# Patient Record
Sex: Male | Born: 1960 | Race: White | Hispanic: No | Marital: Single | Smoking: Current every day smoker
Health system: Southern US, Community
[De-identification: ages and names within clinical notes are randomized; demographics above are authoritative.]

## PROBLEM LIST (undated history)

## (undated) DIAGNOSIS — F419 Anxiety disorder, unspecified: Secondary | ICD-10-CM

## (undated) DIAGNOSIS — I10 Essential (primary) hypertension: Secondary | ICD-10-CM

## (undated) DIAGNOSIS — E785 Hyperlipidemia, unspecified: Secondary | ICD-10-CM

## (undated) HISTORY — DX: Hyperlipidemia, unspecified: E78.5

## (undated) HISTORY — PX: TOTAL HIP ARTHROPLASTY: SHX124

## (undated) HISTORY — DX: Essential (primary) hypertension: I10

## (undated) HISTORY — DX: Anxiety disorder, unspecified: F41.9

---

## 2006-11-22 ENCOUNTER — Ambulatory Visit: Payer: Self-pay | Admitting: Internal Medicine

## 2006-12-16 ENCOUNTER — Ambulatory Visit: Payer: Self-pay | Admitting: Internal Medicine

## 2007-01-15 ENCOUNTER — Ambulatory Visit: Payer: Self-pay | Admitting: Internal Medicine

## 2007-01-15 ENCOUNTER — Ambulatory Visit: Payer: Self-pay | Admitting: General Practice

## 2007-01-22 ENCOUNTER — Inpatient Hospital Stay: Payer: Self-pay | Admitting: General Practice

## 2007-04-22 HISTORY — PX: UMBILICAL HERNIA REPAIR: SHX196

## 2008-05-31 IMAGING — CR DG HIP COMPLETE 2+V*L*
1 series · 1 of 1 positions shown · non-contrast
Comparison: none

REASON FOR EXAM: postop
COMMENTS:   Bedside (portable):Y

PROCEDURE:     DXR - DXR HIP LEFT COMPLETE  - January 22, 2007 [DATE]
RESULT:     Comparison: No available comparison exam.

[view not recorded]
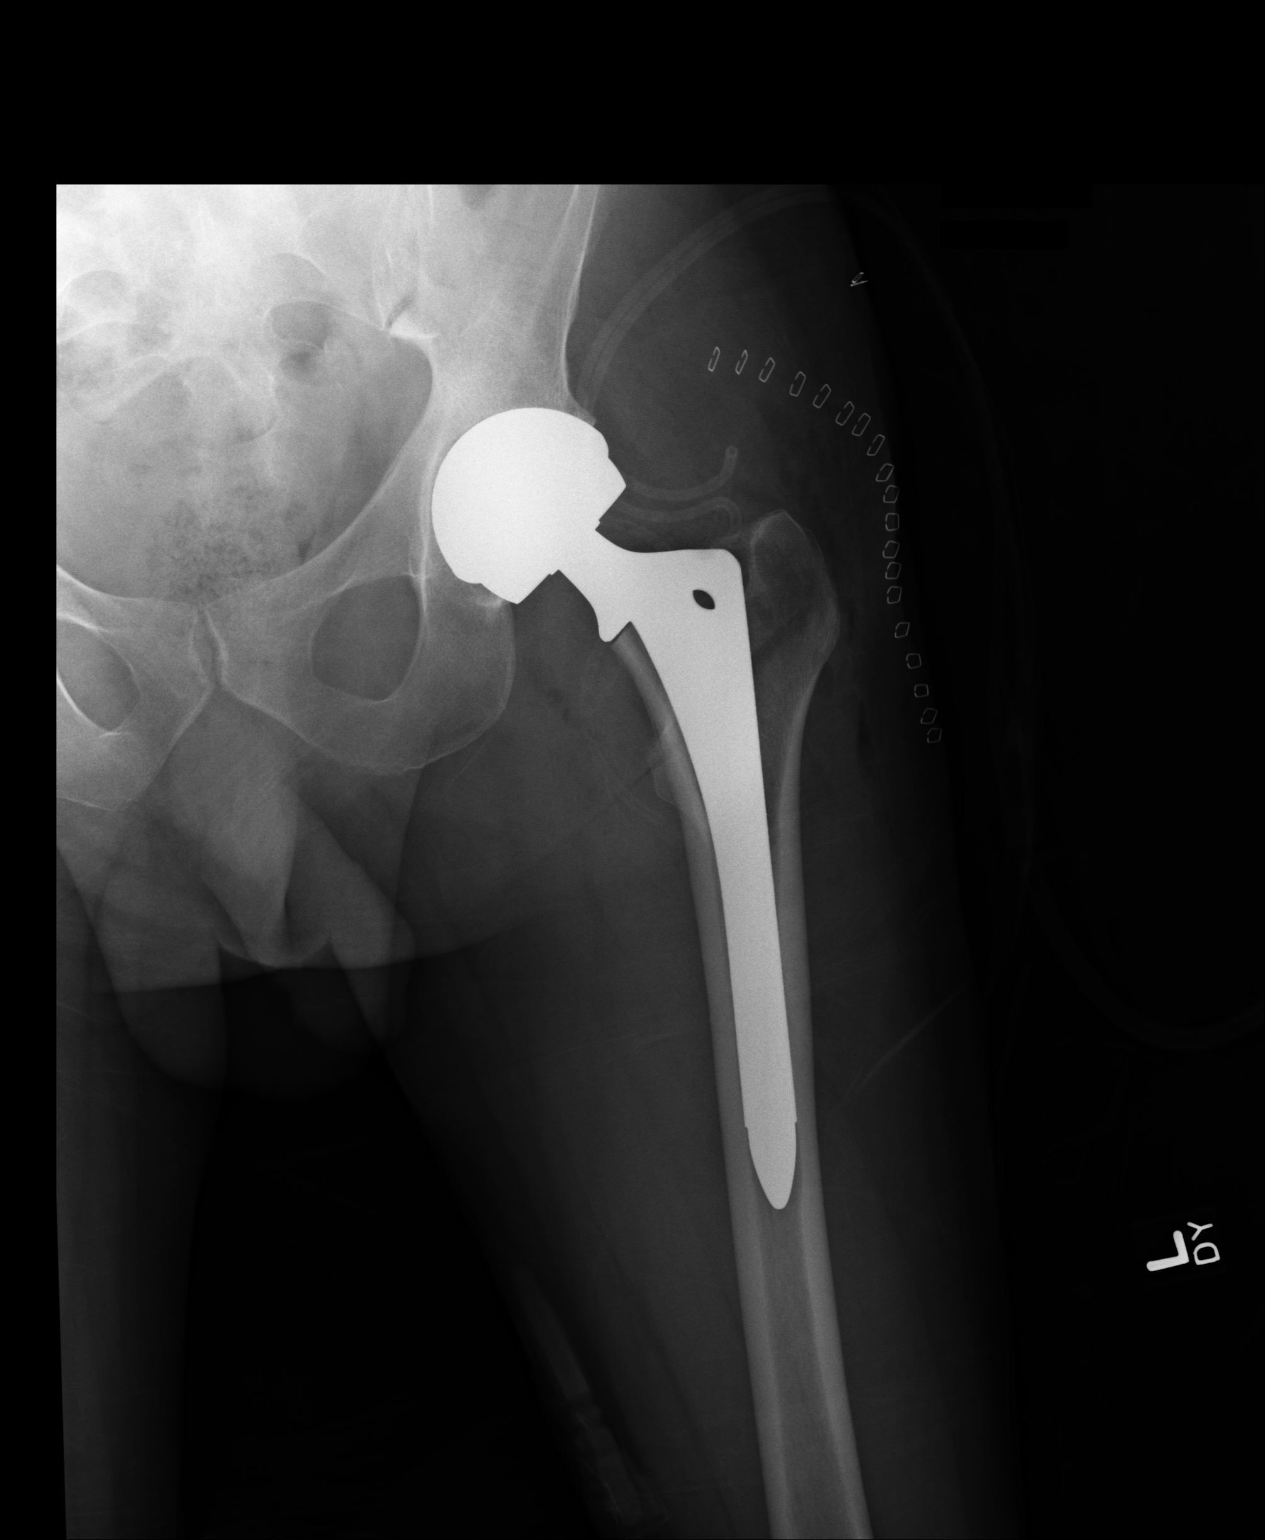

[1 of 1 positions shown; findings below may reference images not displayed]

FINDINGS: Two views of the left hip were obtained.

Left hip arthroplasty is seen with hardware in good position and alignment.
Associated postoperative changes are noted. No fracture is seen.
IMPRESSION: 1. Status post left hip arthroplasty.

## 2015-01-27 ENCOUNTER — Encounter: Payer: Self-pay | Admitting: Family Medicine

## 2015-01-27 ENCOUNTER — Other Ambulatory Visit: Payer: Self-pay

## 2015-01-27 ENCOUNTER — Ambulatory Visit (INDEPENDENT_AMBULATORY_CARE_PROVIDER_SITE_OTHER): Payer: Self-pay | Admitting: Family Medicine

## 2015-01-27 VITALS — BP 112/82 | HR 68 | Temp 97.6°F | Resp 16 | Ht 64.0 in | Wt 159.0 lb

## 2015-01-27 DIAGNOSIS — E781 Pure hyperglyceridemia: Secondary | ICD-10-CM | POA: Insufficient documentation

## 2015-01-27 DIAGNOSIS — J4520 Mild intermittent asthma, uncomplicated: Secondary | ICD-10-CM

## 2015-01-27 DIAGNOSIS — F419 Anxiety disorder, unspecified: Secondary | ICD-10-CM

## 2015-01-27 DIAGNOSIS — C9110 Chronic lymphocytic leukemia of B-cell type not having achieved remission: Secondary | ICD-10-CM | POA: Insufficient documentation

## 2015-01-27 DIAGNOSIS — I10 Essential (primary) hypertension: Secondary | ICD-10-CM | POA: Insufficient documentation

## 2015-01-27 DIAGNOSIS — F17200 Nicotine dependence, unspecified, uncomplicated: Secondary | ICD-10-CM | POA: Insufficient documentation

## 2015-01-27 DIAGNOSIS — M8700 Idiopathic aseptic necrosis of unspecified bone: Secondary | ICD-10-CM

## 2015-01-27 DIAGNOSIS — M879 Osteonecrosis, unspecified: Secondary | ICD-10-CM

## 2015-01-27 DIAGNOSIS — Z856 Personal history of leukemia: Secondary | ICD-10-CM

## 2015-01-27 DIAGNOSIS — J309 Allergic rhinitis, unspecified: Secondary | ICD-10-CM | POA: Insufficient documentation

## 2015-01-27 DIAGNOSIS — Z125 Encounter for screening for malignant neoplasm of prostate: Secondary | ICD-10-CM

## 2015-01-27 MED ORDER — AMLODIPINE BESYLATE 2.5 MG PO TABS: 2.5000 mg | ORAL_TABLET | Freq: Every day | ORAL | Status: DC

## 2015-01-27 MED ORDER — ALBUTEROL SULFATE HFA 108 (90 BASE) MCG/ACT IN AERS: 2.0000 | INHALATION_SPRAY | Freq: Four times a day (QID) | RESPIRATORY_TRACT | Status: DC | PRN

## 2015-01-27 MED ORDER — CLONAZEPAM 1 MG PO TABS: 0.5000 mg | ORAL_TABLET | Freq: Two times a day (BID) | ORAL | Status: DC | PRN

## 2015-01-27 MED ORDER — MELOXICAM 15 MG PO TABS: 15.0000 mg | ORAL_TABLET | Freq: Every day | ORAL | Status: DC

## 2015-01-27 MED ORDER — TRAMADOL HCL 50 MG PO TABS: 50.0000 mg | ORAL_TABLET | Freq: Four times a day (QID) | ORAL | Status: DC | PRN

## 2015-01-27 NOTE — Progress Notes (Signed)
Subjective:     Patient ID: Eddie Ward, male   DOB: [birth year], 54 y.o.   MRN: [id number]  HPI  Chief Complaint  Patient presents with  . Hypertension    patient is here to follow up from last office visit 01/19/2014, patient was advised continue current medication. Last blood pressure in office was 132/62  . Anxiety    patient is here to folloe up from 01/19/14, condition was stable on Clonazepam.  . 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   Review of Systems  Constitutional: Negative for fever, chills and fatigue.  Respiratory: Positive for shortness of breath (tapering cigarette use.).        Controls mild shortness of breath/asthma with use of albuterol MDI 3-4 x day. Could not afford Spiriva in the past.  Cardiovascular: Negative for chest pain and palpitations.  Gastrointestinal: Negative for blood in stool.  Genitourinary: Negative for difficulty urinating.       No nocturia  Hematological: Does not bruise/bleed easily.  Psychiatric/Behavioral:       Continue to control anxiety with twice daily clonazepam.       Objective:   Physical Exam  Constitutional: He appears well-developed and well-nourished. No distress.  Cardiovascular: Normal rate and regular rhythm.   Pulmonary/Chest: Breath sounds normal. He has no wheezes.  Musculoskeletal: He exhibits no edema (of lower extremities).  Antalgic gait       Assessment:    1. Anxiety - clonazePAM (KLONOPIN) 1 MG tablet; Take 0.5 tablets (0.5 mg total) by mouth 2 (two) times daily as needed for anxiety.  Dispense: 60 tablet; Refill: 5  2. Essential hypertensio - Comprehensive metabolic panel - amLODipine  (NORVASC) 2.5 MG tablet; Take 1 tablet (2.5 mg total) by mouth daily.  Dispense: 30 tablet; Refill: 11  3. Hypertriglyceridemia - Lipid panel  4. Aseptic necrosis of bone - meloxicam (MOBIC) 15 MG tablet; Take 1 tablet (15 mg total) by mouth daily.  Dispense: 90 tablet; Refill: 3 - traMADol (ULTRAM) 50 MG tablet; Take 1 tablet (50 mg total) by mouth every 6 (six) hours as needed for moderate pain.  Dispense: 90 tablet; Refill: 5  5. Personal history of CLL (chronic lymphocytic leukemia) - CBC with Differential/Platelet  6. Prostate cancer screenin - PSA  7. Asthma, mild intermittent, uncomplicated - albuterol (VENTOLIN HFA) 108 (90 BASE) MCG/ACT inhaler; Inhale 2 puffs into the lungs every 6 (six) hours as needed for wheezing or shortness of breath.  Dispense: 18 g; Refill: 11    Plan:   Further f/u pending lab results

## 2015-01-27 NOTE — Patient Instructions (Signed)
We will call you with lab results.       

## 2015-02-03 DIAGNOSIS — Z0000 Encounter for general adult medical examination without abnormal findings: Secondary | ICD-10-CM

## 2015-02-03 LAB — CBC WITH DIFFERENTIAL/PLATELET
BASOS ABS: 0 10*3/uL (ref 0.0–0.2)
BASOS: 1 %
EOS (ABSOLUTE): 0.2 10*3/uL (ref 0.0–0.4)
Eos: 3 %
HEMATOCRIT: 42 % (ref 37.5–51.0)
HEMOGLOBIN: 14.8 g/dL (ref 12.6–17.7)
IMMATURE GRANS (ABS): 0 10*3/uL (ref 0.0–0.1)
Immature Granulocytes: 0 %
LYMPHS: 34 %
Lymphocytes Absolute: 2.1 10*3/uL (ref 0.7–3.1)
MCH: 29.7 pg (ref 26.6–33.0)
MCHC: 35.2 g/dL (ref 31.5–35.7)
MCV: 84 fL (ref 79–97)
Monocytes Absolute: 0.6 10*3/uL (ref 0.1–0.9)
Monocytes: 10 %
NEUTROS PCT: 52 %
Neutrophils Absolute: 3.2 10*3/uL (ref 1.4–7.0)
Platelets: 139 10*3/uL — ABNORMAL LOW (ref 150–379)
RBC: 4.98 x10E6/uL (ref 4.14–5.80)
RDW: 13.6 % (ref 12.3–15.4)
WBC: 6.1 10*3/uL (ref 3.4–10.8)

## 2015-02-04 ENCOUNTER — Telehealth: Payer: Self-pay

## 2015-02-04 LAB — COMPREHENSIVE METABOLIC PANEL
A/G RATIO: 1.9 (ref 1.1–2.5)
ALT: 29 IU/L (ref 0–44)
AST: 20 IU/L (ref 0–40)
Albumin: 3.9 g/dL (ref 3.5–5.5)
Alkaline Phosphatase: 68 IU/L (ref 39–117)
BUN / CREAT RATIO: 9 (ref 9–20)
BUN: 11 mg/dL (ref 6–24)
Bilirubin Total: 0.3 mg/dL (ref 0.0–1.2)
CO2: 22 mmol/L (ref 18–29)
CREATININE: 1.18 mg/dL (ref 0.76–1.27)
Calcium: 8.9 mg/dL (ref 8.7–10.2)
Chloride: 100 mmol/L (ref 97–108)
GFR calc Af Amer: 81 mL/min/{1.73_m2} (ref >59)
GFR calc non Af Amer: 70 mL/min/{1.73_m2} (ref >59)
Globulin, Total: 2.1 g/dL (ref 1.5–4.5)
Glucose: 100 mg/dL — ABNORMAL HIGH (ref 65–99)
Potassium: 4.6 mmol/L (ref 3.5–5.2)
SODIUM: 136 mmol/L (ref 134–144)
Total Protein: 6 g/dL (ref 6.0–8.5)

## 2015-02-04 LAB — LIPID PANEL
CHOL/HDL RATIO: 8.3 ratio — AB (ref 0.0–5.0)
Cholesterol, Total: 166 mg/dL (ref 100–199)
HDL: 20 mg/dL — ABNORMAL LOW (ref >39)
LDL CALC: 95 mg/dL (ref 0–99)
Triglycerides: 254 mg/dL — ABNORMAL HIGH (ref 0–149)
VLDL Cholesterol Cal: 51 mg/dL — ABNORMAL HIGH (ref 5–40)

## 2015-02-04 LAB — PSA: Prostate Specific Ag, Serum: 0.9 ng/mL (ref 0.0–4.0)

## 2015-02-04 NOTE — Telephone Encounter (Signed)
[id number] 

## 2015-02-04 NOTE — Telephone Encounter (Signed)
-----   Message from Carmon Ginsberg, [place] sent at 02/04/2015  8:58 AM EDT ----- Labs are generally ok but due to low HDL cholesterol, smoking, and hx of hypertension your calculated 10 year risk for developing cardiovascular disease is 15.7 %. Statin medication is usually recommended above 7.5 %. What do you wish to do?

## 2015-02-05 NOTE — Telephone Encounter (Signed)
Patient has been advised

## 2015-02-05 NOTE — Telephone Encounter (Signed)
We have been prescribing 1 mg. My thinking was he was splitting them to get the 0.5 mg.dose

## 2015-02-05 NOTE — Telephone Encounter (Signed)
Spoke with patient on phone he stated at last office visit that you both discussed weaning patient off medication because he did not need it anymore. Patient states at office visit you stated that patient will start at 1/2 tablet and slowly taper off. He wants to know what he should do because he does not want to stop medication completley. Please advise.

## 2015-02-05 NOTE — Telephone Encounter (Signed)
Pt called this morning.  He would like someone to call him back asap regarding his labs and his Rx.    [phone]

## 2015-02-05 NOTE — Telephone Encounter (Signed)
Try 1/2 pill once daily for 1-2 weeks then every other day for 2 weeks. Once off may use occasionally as needed for acute anxiety.

## 2015-02-05 NOTE — Telephone Encounter (Signed)
Patient was advised of lab results he states that he is declining at this time a statin drug and his HDL cholesterol will always be poor due to lack of exercise because of his bad knees. Patient wanted to note that his Rx for Clonazepam that was sent to pharmacy says 1mg  daily and he states he was taking 1/2mg . He wants to know if this was an error on the pharmacy side or did you increase dose? Please advise. KW

## 2015-02-08 ENCOUNTER — Telehealth: Payer: Self-pay

## 2015-02-08 NOTE — Telephone Encounter (Signed)
-----   Message from Carmon Ginsberg, [place] sent at 02/04/2015  8:58 AM EDT ----- Labs are generally ok but due to low HDL cholesterol, smoking, and hx of hypertension your calculated 10 year risk for developing cardiovascular disease is 15.7 %. Statin medication is usually recommended above 7.5 %. What do you wish to do?

## 2015-03-01 ENCOUNTER — Other Ambulatory Visit: Payer: Self-pay | Admitting: Family Medicine

## 2015-03-01 ENCOUNTER — Telehealth: Payer: Self-pay | Admitting: Family Medicine

## 2015-03-01 DIAGNOSIS — F419 Anxiety disorder, unspecified: Secondary | ICD-10-CM

## 2015-03-01 MED ORDER — CLONAZEPAM 0.5 MG PO TABS: 0.5000 mg | ORAL_TABLET | Freq: Two times a day (BID) | ORAL | Status: DC | PRN

## 2015-03-01 MED ORDER — CLONAZEPAM 1 MG PO TABS: 0.5000 mg | ORAL_TABLET | Freq: Two times a day (BID) | ORAL | Status: DC | PRN

## 2015-03-01 NOTE — Telephone Encounter (Signed)
New rx is available to be picked up here.

## 2015-03-01 NOTE — Telephone Encounter (Signed)
Left message to call back  

## 2015-03-01 NOTE — Telephone Encounter (Signed)
Pt contacted office for refill request on the following medications:  clonazePAM (KLONOPIN) 1 MG.  Pt states this should be for .05mg .  [place].  CB#[phone]

## 2015-03-01 NOTE — Telephone Encounter (Signed)
Refill Request.  

## 2015-06-23 ENCOUNTER — Other Ambulatory Visit: Payer: Self-pay | Admitting: Family Medicine

## 2015-07-29 ENCOUNTER — Other Ambulatory Visit: Payer: Self-pay | Admitting: Family Medicine

## 2015-07-29 ENCOUNTER — Telehealth: Payer: Self-pay

## 2015-07-29 NOTE — Telephone Encounter (Signed)
-----   Message from Carmon Ginsberg, [place] sent at 07/29/2015 12:30 PM EST ----- Please call in rx for tramadol as updated in the EMR

## 2015-07-29 NOTE — Telephone Encounter (Signed)
Called in prescription to [facility] on [address]. KW

## 2015-07-30 ENCOUNTER — Other Ambulatory Visit: Payer: Self-pay | Admitting: Family Medicine

## 2015-08-29 ENCOUNTER — Other Ambulatory Visit: Payer: Self-pay | Admitting: Family Medicine

## 2015-08-30 ENCOUNTER — Other Ambulatory Visit: Payer: Self-pay | Admitting: Family Medicine

## 2015-11-08 ENCOUNTER — Other Ambulatory Visit: Payer: Self-pay | Admitting: Family Medicine

## 2016-01-20 ENCOUNTER — Other Ambulatory Visit: Payer: Self-pay | Admitting: Family Medicine

## 2016-01-25 ENCOUNTER — Ambulatory Visit (INDEPENDENT_AMBULATORY_CARE_PROVIDER_SITE_OTHER): Payer: Self-pay | Admitting: Family Medicine

## 2016-01-25 ENCOUNTER — Encounter: Payer: Self-pay | Admitting: Family Medicine

## 2016-01-25 VITALS — BP 140/90 | HR 105 | Temp 97.7°F | Resp 20 | Ht 64.0 in | Wt 160.0 lb

## 2016-01-25 DIAGNOSIS — I10 Essential (primary) hypertension: Secondary | ICD-10-CM

## 2016-01-25 DIAGNOSIS — F419 Anxiety disorder, unspecified: Secondary | ICD-10-CM

## 2016-01-25 DIAGNOSIS — J4530 Mild persistent asthma, uncomplicated: Secondary | ICD-10-CM

## 2016-01-25 DIAGNOSIS — J45909 Unspecified asthma, uncomplicated: Secondary | ICD-10-CM | POA: Insufficient documentation

## 2016-01-25 DIAGNOSIS — F17200 Nicotine dependence, unspecified, uncomplicated: Secondary | ICD-10-CM

## 2016-01-25 DIAGNOSIS — M8700 Idiopathic aseptic necrosis of unspecified bone: Secondary | ICD-10-CM

## 2016-01-25 MED ORDER — MONTELUKAST SODIUM 10 MG PO TABS: 10.0000 mg | ORAL_TABLET | Freq: Every day | ORAL | Status: DC

## 2016-01-25 NOTE — Patient Instructions (Signed)
Let me know how the Singulair is doing.

## 2016-01-25 NOTE — Progress Notes (Signed)
Subjective:     Patient ID: Eddie Ward, male   DOB: May 14, 1961, 54 y.o.   MRN: [id number]  HPI Chief Complaint  Patient presents with  . Anxiety  . Hypertension  States he is here in f/u of his chronic medical problems. -currently uninsured. Mainly concerned about frequent use of Ventolin MDI and wishes to try Singulair for maintenance. Continues to smoke 1/2 ppd. Does not wish labs or colonoscopy at this time.  Review of Systems  Respiratory: Negative for shortness of breath (except associated with asthma).   Cardiovascular: Negative for chest pain and palpitations.       Monitors his bp at home and is usually lower than today.  Musculoskeletal:       Right hip pain from AVN is getting worse but is working on health insurance to try to afford surgery. Currently maintained with meloxicam and tramadol 3 x day.  Psychiatric/Behavioral:       Anxiety controlled with clonazepam.       Objective:   Physical Exam  Constitutional: He appears well-developed and well-nourished. Distressed: walks with a limp.  Cardiovascular: Normal rate and regular rhythm.   Pulmonary/Chest: Breath sounds normal. He has no wheezes.  Musculoskeletal: He exhibits no edema (of lower extremties).       Assessment:    1. Anxiety: stable  2. Essential hypertension: stable  3. Aseptic necrosis of bone ([place]) 4. Tobacco use disorder  5. Asthma, mild persistent, uncomplicated - montelukast (SINGULAIR) 10 MG tablet; Take 1 tablet (10 mg total) by mouth at bedtime.  Dispense: 30 tablet; Refill: 11    Plan:    Phone f/u regarding asthma and refills as needed.

## 2016-01-26 ENCOUNTER — Other Ambulatory Visit: Payer: Self-pay | Admitting: Family Medicine

## 2016-01-26 DIAGNOSIS — I10 Essential (primary) hypertension: Secondary | ICD-10-CM

## 2016-01-26 NOTE — Telephone Encounter (Signed)
LOV 01/25/2016. Renaldo Fiddler, CMA

## 2016-02-22 ENCOUNTER — Other Ambulatory Visit: Payer: Self-pay | Admitting: Family Medicine

## 2016-02-23 ENCOUNTER — Other Ambulatory Visit: Payer: Self-pay | Admitting: Family Medicine

## 2016-02-23 NOTE — Telephone Encounter (Signed)
prescription was called into pharmacy. KW

## 2016-03-16 ENCOUNTER — Other Ambulatory Visit: Payer: Self-pay | Admitting: Family Medicine

## 2016-03-16 NOTE — Telephone Encounter (Signed)
Rx has been called into pharmacy. KW 

## 2016-07-17 HISTORY — PX: TOTAL HIP ARTHROPLASTY: SHX124

## 2016-07-20 ENCOUNTER — Other Ambulatory Visit: Payer: Self-pay | Admitting: Family Medicine

## 2016-07-20 NOTE — Telephone Encounter (Signed)
LOV 01/25/2016. Renaldo Fiddler, CMA

## 2016-08-20 ENCOUNTER — Other Ambulatory Visit: Payer: Self-pay | Admitting: Family Medicine

## 2016-08-21 ENCOUNTER — Other Ambulatory Visit: Payer: Self-pay | Admitting: Family Medicine

## 2016-08-21 NOTE — Telephone Encounter (Signed)
Prescription has been called into pharmacy. KW 

## 2016-08-21 NOTE — Telephone Encounter (Signed)
Called in. Eddie Ward, CMA  

## 2016-09-21 ENCOUNTER — Other Ambulatory Visit: Payer: Self-pay | Admitting: Family Medicine

## 2016-09-21 NOTE — Telephone Encounter (Signed)
prescription has been called into pharmacy. KW 

## 2016-11-08 DIAGNOSIS — M25551 Pain in right hip: Secondary | ICD-10-CM | POA: Diagnosis not present

## 2016-11-08 DIAGNOSIS — M87051 Idiopathic aseptic necrosis of right femur: Secondary | ICD-10-CM | POA: Diagnosis not present

## 2016-11-08 DIAGNOSIS — G8929 Other chronic pain: Secondary | ICD-10-CM | POA: Diagnosis not present

## 2016-11-08 DIAGNOSIS — Z96642 Presence of left artificial hip joint: Secondary | ICD-10-CM | POA: Diagnosis not present

## 2017-01-10 DIAGNOSIS — M25551 Pain in right hip: Secondary | ICD-10-CM | POA: Diagnosis not present

## 2017-01-10 DIAGNOSIS — M87051 Idiopathic aseptic necrosis of right femur: Secondary | ICD-10-CM | POA: Diagnosis not present

## 2017-01-10 DIAGNOSIS — G8929 Other chronic pain: Secondary | ICD-10-CM | POA: Diagnosis not present

## 2017-01-10 DIAGNOSIS — Z01818 Encounter for other preprocedural examination: Secondary | ICD-10-CM | POA: Diagnosis not present

## 2017-01-22 DIAGNOSIS — R9431 Abnormal electrocardiogram [ECG] [EKG]: Secondary | ICD-10-CM | POA: Diagnosis not present

## 2017-01-24 DIAGNOSIS — M1611 Unilateral primary osteoarthritis, right hip: Secondary | ICD-10-CM | POA: Diagnosis not present

## 2017-01-30 ENCOUNTER — Encounter: Payer: Self-pay | Admitting: Family Medicine

## 2017-01-30 ENCOUNTER — Ambulatory Visit (INDEPENDENT_AMBULATORY_CARE_PROVIDER_SITE_OTHER): Payer: 59 | Admitting: Family Medicine

## 2017-01-30 VITALS — BP 122/84 | HR 86 | Temp 98.2°F | Resp 16 | Wt 149.8 lb

## 2017-01-30 DIAGNOSIS — M8700 Idiopathic aseptic necrosis of unspecified bone: Secondary | ICD-10-CM | POA: Diagnosis not present

## 2017-01-30 DIAGNOSIS — F419 Anxiety disorder, unspecified: Secondary | ICD-10-CM | POA: Diagnosis not present

## 2017-01-30 DIAGNOSIS — I10 Essential (primary) hypertension: Secondary | ICD-10-CM

## 2017-01-30 MED ORDER — AMLODIPINE BESYLATE 5 MG PO TABS: 5.0000 mg | ORAL_TABLET | Freq: Every day | ORAL | 0 refills | Status: DC

## 2017-01-30 NOTE — Patient Instructions (Signed)
Good luck with your surgery. We can recheck your blood pressure after you have completed post-operative recovery.

## 2017-01-30 NOTE — Progress Notes (Signed)
Subjective:     Patient ID: Eddie Ward, male   DOB: [birth year], 56 y.o.   MRN: [id number]  HPI  Chief Complaint  Patient presents with  . Advice Only    Patient comes in office today to discuss with P.A about his upcoming hip replacement to the right hip on 02/06/17.   States he has gotten to the point his quality of life has declined due to his R hip AVN. His surgeon will be Burnis Kingfisher at [facility]. He has quit smoking a week ago and is tapering down his alcohol use. Reports using Ventolin only once daily now. Had preop workup including echocardiogram (normal) and labs. Amlodipine was increased to 5 mg. Continues to use clonazepam twice daily but may wish to taper off after he is able to resume normal activities again.   Review of Systems     Objective:   Physical Exam  Constitutional: He appears well-developed and well-nourished. No distress.  Cardiovascular: Normal rate and regular rhythm.   Pulmonary/Chest: He has no wheezes.  Diminished breath sounds bilaterally  Musculoskeletal: He exhibits no edema (of lower extremities).  Psychiatric:  anxious       Assessment:    1. Essential hypertension - amLODipine (NORVASC) 5 MG tablet; Take 1 tablet (5 mg total) by mouth daily.  Dispense: 90 tablet; Refill: 0  2. Aseptic necrosis of bone [facility]): pending surgery 7/24  3. Anxiety: continue clonazepam    Plan:    F/u after post-surgical recovery completed

## 2017-02-06 DIAGNOSIS — I10 Essential (primary) hypertension: Secondary | ICD-10-CM | POA: Diagnosis not present

## 2017-02-06 DIAGNOSIS — E785 Hyperlipidemia, unspecified: Secondary | ICD-10-CM | POA: Diagnosis not present

## 2017-02-06 DIAGNOSIS — Z96641 Presence of right artificial hip joint: Secondary | ICD-10-CM | POA: Diagnosis not present

## 2017-02-06 DIAGNOSIS — Z471 Aftercare following joint replacement surgery: Secondary | ICD-10-CM | POA: Diagnosis not present

## 2017-02-06 DIAGNOSIS — R7989 Other specified abnormal findings of blood chemistry: Secondary | ICD-10-CM | POA: Diagnosis not present

## 2017-02-06 DIAGNOSIS — M1611 Unilateral primary osteoarthritis, right hip: Secondary | ICD-10-CM | POA: Diagnosis not present

## 2017-02-06 DIAGNOSIS — R7309 Other abnormal glucose: Secondary | ICD-10-CM | POA: Diagnosis not present

## 2017-02-08 DIAGNOSIS — J4520 Mild intermittent asthma, uncomplicated: Secondary | ICD-10-CM | POA: Diagnosis not present

## 2017-02-08 DIAGNOSIS — Z471 Aftercare following joint replacement surgery: Secondary | ICD-10-CM | POA: Diagnosis not present

## 2017-02-08 DIAGNOSIS — I10 Essential (primary) hypertension: Secondary | ICD-10-CM | POA: Diagnosis not present

## 2017-02-12 ENCOUNTER — Encounter: Payer: Self-pay | Admitting: Family Medicine

## 2017-02-12 ENCOUNTER — Ambulatory Visit (INDEPENDENT_AMBULATORY_CARE_PROVIDER_SITE_OTHER): Payer: 59 | Admitting: Family Medicine

## 2017-02-12 VITALS — BP 138/86 | HR 85 | Temp 99.4°F | Resp 16 | Wt 150.4 lb

## 2017-02-12 DIAGNOSIS — R944 Abnormal results of kidney function studies: Secondary | ICD-10-CM

## 2017-02-12 DIAGNOSIS — I10 Essential (primary) hypertension: Secondary | ICD-10-CM

## 2017-02-12 NOTE — Patient Instructions (Signed)
We will call you with the lab results. 

## 2017-02-12 NOTE — Progress Notes (Signed)
Subjective:     Patient ID: Eddie Ward, male   DOB: September 15, 1960, 56 y.o.   MRN: [id number]  HPI  Chief Complaint  Patient presents with  . Abnormal Lab    Patient comes in office today to discuss abnormal lab report showing a elevated kidney function after  right THR 02/06/17.   GFR was noted to be 59. Patient states he was not allowed to drink for 30 hours prior to surgery and for a time afterwards. 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 States he has been able to stay off cigarettes and beer since before surgery.  Review of Systems     Objective:   Physical Exam  Constitutional: He appears well-developed and well-nourished. No distress.  Cardiovascular: Normal rate and regular rhythm.   Pulmonary/Chest: Breath sounds normal.  Musculoskeletal: He exhibits edema (post operative swelling of RLE).       Assessment:    1. Essential hypertension: stable  2. Decreased GFR: will get labs in the next week - Renal function panel    Plan:   Further f/u pending lab work.

## 2017-02-13 DIAGNOSIS — I10 Essential (primary) hypertension: Secondary | ICD-10-CM | POA: Diagnosis not present

## 2017-02-13 DIAGNOSIS — J4520 Mild intermittent asthma, uncomplicated: Secondary | ICD-10-CM | POA: Diagnosis not present

## 2017-02-13 DIAGNOSIS — Z471 Aftercare following joint replacement surgery: Secondary | ICD-10-CM | POA: Diagnosis not present

## 2017-02-15 DIAGNOSIS — J4520 Mild intermittent asthma, uncomplicated: Secondary | ICD-10-CM | POA: Diagnosis not present

## 2017-02-15 DIAGNOSIS — I10 Essential (primary) hypertension: Secondary | ICD-10-CM | POA: Diagnosis not present

## 2017-02-15 DIAGNOSIS — Z471 Aftercare following joint replacement surgery: Secondary | ICD-10-CM | POA: Diagnosis not present

## 2017-02-19 ENCOUNTER — Ambulatory Visit: Payer: 59 | Admitting: Family Medicine

## 2017-02-19 ENCOUNTER — Other Ambulatory Visit: Payer: Self-pay | Admitting: Family Medicine

## 2017-02-19 NOTE — Telephone Encounter (Signed)
Called in. Left message at [facility] as written below.

## 2017-02-20 DIAGNOSIS — J4520 Mild intermittent asthma, uncomplicated: Secondary | ICD-10-CM | POA: Diagnosis not present

## 2017-02-20 DIAGNOSIS — I10 Essential (primary) hypertension: Secondary | ICD-10-CM | POA: Diagnosis not present

## 2017-02-20 DIAGNOSIS — Z471 Aftercare following joint replacement surgery: Secondary | ICD-10-CM | POA: Diagnosis not present

## 2017-02-21 DIAGNOSIS — R944 Abnormal results of kidney function studies: Secondary | ICD-10-CM | POA: Diagnosis not present

## 2017-02-22 ENCOUNTER — Telehealth: Payer: Self-pay

## 2017-02-22 DIAGNOSIS — I10 Essential (primary) hypertension: Secondary | ICD-10-CM | POA: Diagnosis not present

## 2017-02-22 DIAGNOSIS — Z471 Aftercare following joint replacement surgery: Secondary | ICD-10-CM | POA: Diagnosis not present

## 2017-02-22 DIAGNOSIS — J4520 Mild intermittent asthma, uncomplicated: Secondary | ICD-10-CM | POA: Diagnosis not present

## 2017-02-22 LAB — RENAL FUNCTION PANEL
ALBUMIN: 3.8 g/dL (ref 3.5–5.5)
BUN/Creatinine Ratio: 6 — ABNORMAL LOW (ref 9–20)
BUN: 5 mg/dL — ABNORMAL LOW (ref 6–24)
CHLORIDE: 102 mmol/L (ref 96–106)
CO2: 24 mmol/L (ref 20–29)
Calcium: 8.9 mg/dL (ref 8.7–10.2)
Creatinine, Ser: 0.87 mg/dL (ref 0.76–1.27)
GFR calc non Af Amer: 97 mL/min/{1.73_m2} (ref >59)
GFR, EST AFRICAN AMERICAN: 112 mL/min/{1.73_m2} (ref >59)
GLUCOSE: 88 mg/dL (ref 65–99)
POTASSIUM: 4.1 mmol/L (ref 3.5–5.2)
Phosphorus: 4 mg/dL (ref 2.5–4.5)
Sodium: 141 mmol/L (ref 134–144)

## 2017-02-22 NOTE — Telephone Encounter (Signed)
Patient has been advised. KW 

## 2017-02-22 NOTE — Telephone Encounter (Signed)
-----   Message from Carmon Ginsberg, [place] sent at 02/22/2017  7:28 AM EDT ----- Your kidney function if now excellent with a GFR of 97

## 2017-02-23 DIAGNOSIS — Z471 Aftercare following joint replacement surgery: Secondary | ICD-10-CM | POA: Diagnosis not present

## 2017-02-23 DIAGNOSIS — Z96641 Presence of right artificial hip joint: Secondary | ICD-10-CM | POA: Diagnosis not present

## 2017-02-27 DIAGNOSIS — J4520 Mild intermittent asthma, uncomplicated: Secondary | ICD-10-CM | POA: Diagnosis not present

## 2017-02-27 DIAGNOSIS — Z471 Aftercare following joint replacement surgery: Secondary | ICD-10-CM | POA: Diagnosis not present

## 2017-02-27 DIAGNOSIS — I10 Essential (primary) hypertension: Secondary | ICD-10-CM | POA: Diagnosis not present

## 2017-03-02 DIAGNOSIS — J4520 Mild intermittent asthma, uncomplicated: Secondary | ICD-10-CM | POA: Diagnosis not present

## 2017-03-02 DIAGNOSIS — I10 Essential (primary) hypertension: Secondary | ICD-10-CM | POA: Diagnosis not present

## 2017-03-02 DIAGNOSIS — Z471 Aftercare following joint replacement surgery: Secondary | ICD-10-CM | POA: Diagnosis not present

## 2017-03-07 DIAGNOSIS — J4520 Mild intermittent asthma, uncomplicated: Secondary | ICD-10-CM | POA: Diagnosis not present

## 2017-03-07 DIAGNOSIS — I10 Essential (primary) hypertension: Secondary | ICD-10-CM | POA: Diagnosis not present

## 2017-03-07 DIAGNOSIS — Z471 Aftercare following joint replacement surgery: Secondary | ICD-10-CM | POA: Diagnosis not present

## 2017-03-14 DIAGNOSIS — I10 Essential (primary) hypertension: Secondary | ICD-10-CM | POA: Diagnosis not present

## 2017-03-14 DIAGNOSIS — J4520 Mild intermittent asthma, uncomplicated: Secondary | ICD-10-CM | POA: Diagnosis not present

## 2017-03-14 DIAGNOSIS — Z471 Aftercare following joint replacement surgery: Secondary | ICD-10-CM | POA: Diagnosis not present

## 2017-03-28 DIAGNOSIS — Z96641 Presence of right artificial hip joint: Secondary | ICD-10-CM | POA: Diagnosis not present

## 2017-03-28 DIAGNOSIS — Z96642 Presence of left artificial hip joint: Secondary | ICD-10-CM | POA: Diagnosis not present

## 2017-03-28 DIAGNOSIS — Z471 Aftercare following joint replacement surgery: Secondary | ICD-10-CM | POA: Diagnosis not present

## 2017-03-30 DIAGNOSIS — I10 Essential (primary) hypertension: Secondary | ICD-10-CM | POA: Diagnosis not present

## 2017-03-30 DIAGNOSIS — J4520 Mild intermittent asthma, uncomplicated: Secondary | ICD-10-CM | POA: Diagnosis not present

## 2017-03-30 DIAGNOSIS — Z471 Aftercare following joint replacement surgery: Secondary | ICD-10-CM | POA: Diagnosis not present

## 2017-04-12 ENCOUNTER — Telehealth: Payer: Self-pay

## 2017-04-12 NOTE — Telephone Encounter (Signed)
Patient called office today upset stating that his insurance company had received his medical records and in records it indicated accoording to patient that he had a active diagnosis of leukemia dated back in office note on 01/2015. I explained to patient that I did not see it as a current active problem and in 2016 his labs did show a low platelet count but other labs were normal and it was never followed back up in reference to platelet count. Patient states that insurance believes that he has lied about his medical history and now needs letter from [facility] stating that patient does not have leukemia or any form of cancer.Patient has been advised that you are out of office till 04-23-17. He is asking when you return to contact his insurance agent Yehuda Budd [phone]. KW

## 2017-04-12 NOTE — Telephone Encounter (Signed)
Address for letter is: [address] [place] [address]

## 2017-04-23 NOTE — Telephone Encounter (Signed)
Letter provided for patient pickup

## 2017-04-30 ENCOUNTER — Other Ambulatory Visit: Payer: Self-pay | Admitting: Family Medicine

## 2017-04-30 DIAGNOSIS — I10 Essential (primary) hypertension: Secondary | ICD-10-CM

## 2017-08-07 ENCOUNTER — Other Ambulatory Visit: Payer: Self-pay | Admitting: Family Medicine

## 2017-08-08 ENCOUNTER — Other Ambulatory Visit: Payer: Self-pay | Admitting: Family Medicine

## 2017-08-08 NOTE — Telephone Encounter (Signed)
Please confirm this was called in as updated 1/22.

## 2017-08-08 NOTE — Telephone Encounter (Signed)
Pharmacist states that prescription has already been called into pharmacy.KW

## 2017-08-08 NOTE — Telephone Encounter (Signed)
Prescription was called into pharmacy and patient was notified. KW

## 2017-10-09 ENCOUNTER — Encounter: Payer: Self-pay | Admitting: Family Medicine

## 2017-10-09 ENCOUNTER — Ambulatory Visit (INDEPENDENT_AMBULATORY_CARE_PROVIDER_SITE_OTHER): Payer: 59 | Admitting: Family Medicine

## 2017-10-09 VITALS — BP 122/84 | HR 96 | Temp 97.5°F | Resp 16 | Wt 152.8 lb

## 2017-10-09 DIAGNOSIS — J4530 Mild persistent asthma, uncomplicated: Secondary | ICD-10-CM | POA: Diagnosis not present

## 2017-10-09 DIAGNOSIS — L309 Dermatitis, unspecified: Secondary | ICD-10-CM

## 2017-10-09 DIAGNOSIS — E781 Pure hyperglyceridemia: Secondary | ICD-10-CM | POA: Diagnosis not present

## 2017-10-09 DIAGNOSIS — I10 Essential (primary) hypertension: Secondary | ICD-10-CM | POA: Diagnosis not present

## 2017-10-09 DIAGNOSIS — Z125 Encounter for screening for malignant neoplasm of prostate: Secondary | ICD-10-CM | POA: Diagnosis not present

## 2017-10-09 DIAGNOSIS — J3089 Other allergic rhinitis: Secondary | ICD-10-CM | POA: Diagnosis not present

## 2017-10-09 MED ORDER — PREDNISONE 20 MG PO TABS: ORAL_TABLET | ORAL | 1 refills | Status: DC

## 2017-10-09 MED ORDER — MONTELUKAST SODIUM 10 MG PO TABS: 10.0000 mg | ORAL_TABLET | Freq: Every day | ORAL | 0 refills | Status: DC

## 2017-10-09 NOTE — Patient Instructions (Signed)
We will call you with the lab results. Remind me about clonazepam refills when due. Let me know if prednisone helps your symptoms.

## 2017-10-09 NOTE — Progress Notes (Signed)
Subjective:     Patient ID: Eddie Ward, male   DOB: 11-07-1960, 57 y.o.   MRN: [id number] Chief Complaint  Patient presents with  . Hypertension    Patient retruns to office today for follow up, patient was last seen 02/12/17 blood pressure in office was 138/86 and condition stable. Patient reports good compliance and fair tolerance on medication, patient would like to discuss dosage today of medication. Patient reports since medication was increased last year he has been having symptoms of seasonal allergies   HPI He is most concerned about poorly controlled allergy and asthma sx and various discrete pruritic rashes on his scalp, right hip, and flank. He has resumed smoking 1/2 ppd but has discontinued alcohol. States he is using albuterol 3 x day and a variety of oral antihistamines without improvement. 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  Review of Systems  Respiratory: Positive for wheezing. Negative for shortness of breath.   Cardiovascular: Negative for chest pain and palpitations.  Gastrointestinal:       Defers colonoscopy  Genitourinary:       No nocturia  Psychiatric/Behavioral:       Using clonazepam twice daily but wishes to try a taper to 2.5 mg. at the next refill       Objective:   Physical Exam  Constitutional: He appears well-developed and well-nourished. No distress.  Cardiovascular: Normal rate and regular rhythm.  Pulmonary/Chest: Breath sounds normal. He has no wheezes.  Musculoskeletal: He exhibits no edema (of lower extremities).  Skin:  Non specific small papules in his right hip area and mid back. Scalp 3-4 slightly raised annular, erythematous lesons without scaling.       Assessment:    1. Essential hypertension - Comprehensive metabolic panel  2. Mild persistent asthma without complication: start Singulair/prednisone  3. Non-seasonal allergic rhinitis, unspecified trigger: start prednisone   4. Hypertriglyceridemia - Lipid  panel  5. Screening for prostate cancer - PSA 6. Dermatitis: prednisone    Plan:    Further f/u pending lab work

## 2017-10-10 DIAGNOSIS — I10 Essential (primary) hypertension: Secondary | ICD-10-CM | POA: Diagnosis not present

## 2017-10-10 DIAGNOSIS — Z125 Encounter for screening for malignant neoplasm of prostate: Secondary | ICD-10-CM | POA: Diagnosis not present

## 2017-10-10 DIAGNOSIS — E781 Pure hyperglyceridemia: Secondary | ICD-10-CM | POA: Diagnosis not present

## 2017-10-11 LAB — COMPREHENSIVE METABOLIC PANEL
ALK PHOS: 88 IU/L (ref 39–117)
ALT: 18 IU/L (ref 0–44)
AST: 10 IU/L (ref 0–40)
Albumin/Globulin Ratio: 2.3 — ABNORMAL HIGH (ref 1.2–2.2)
Albumin: 4.4 g/dL (ref 3.5–5.5)
BUN/Creatinine Ratio: 7 — ABNORMAL LOW (ref 9–20)
BUN: 6 mg/dL (ref 6–24)
CHLORIDE: 102 mmol/L (ref 96–106)
CO2: 21 mmol/L (ref 20–29)
Calcium: 9.1 mg/dL (ref 8.7–10.2)
Creatinine, Ser: 0.87 mg/dL (ref 0.76–1.27)
GFR calc Af Amer: 112 mL/min/{1.73_m2} (ref >59)
GFR calc non Af Amer: 96 mL/min/{1.73_m2} (ref >59)
Globulin, Total: 1.9 g/dL (ref 1.5–4.5)
Glucose: 95 mg/dL (ref 65–99)
Potassium: 3.9 mmol/L (ref 3.5–5.2)
Sodium: 141 mmol/L (ref 134–144)
Total Protein: 6.3 g/dL (ref 6.0–8.5)

## 2017-10-11 LAB — LIPID PANEL
CHOL/HDL RATIO: 8.1 ratio — AB (ref 0.0–5.0)
Cholesterol, Total: 171 mg/dL (ref 100–199)
HDL: 21 mg/dL — AB (ref >39)
LDL Calculated: 88 mg/dL (ref 0–99)
Triglycerides: 310 mg/dL — ABNORMAL HIGH (ref 0–149)
VLDL CHOLESTEROL CAL: 62 mg/dL — AB (ref 5–40)

## 2017-10-11 LAB — PSA: PROSTATE SPECIFIC AG, SERUM: 0.9 ng/mL (ref 0.0–4.0)

## 2017-10-31 ENCOUNTER — Telehealth: Payer: Self-pay | Admitting: Family Medicine

## 2017-10-31 ENCOUNTER — Other Ambulatory Visit: Payer: Self-pay | Admitting: Family Medicine

## 2017-10-31 MED ORDER — CLONAZEPAM 0.5 MG PO TABS: 0.5000 mg | ORAL_TABLET | Freq: Two times a day (BID) | ORAL | 1 refills | Status: DC | PRN

## 2017-10-31 NOTE — Telephone Encounter (Signed)
I have sent in the clonazepam to [facility]. Cancel the refills at [facility]

## 2017-10-31 NOTE — Telephone Encounter (Signed)
This was sent 08/07/17 to [facility] with 5 additional refills. Please review and advise if I need to cancel Rx. KW

## 2017-10-31 NOTE — Telephone Encounter (Signed)
Spoke with pharmacist [facility]) and cancelled Rx. Eddie Ward

## 2017-10-31 NOTE — Telephone Encounter (Signed)
Pt needs a refill on his clonazepam .25 He said he needs 3 month supply  Pt uses [facility]  Pt's call back [phone]  Thanks teri

## 2017-12-24 ENCOUNTER — Other Ambulatory Visit: Payer: Self-pay | Admitting: Family Medicine

## 2018-02-03 ENCOUNTER — Other Ambulatory Visit: Payer: Self-pay | Admitting: Family Medicine

## 2018-02-03 DIAGNOSIS — I10 Essential (primary) hypertension: Secondary | ICD-10-CM

## 2018-04-09 ENCOUNTER — Other Ambulatory Visit: Payer: Self-pay | Admitting: Family Medicine

## 2018-04-18 ENCOUNTER — Other Ambulatory Visit: Payer: Self-pay | Admitting: Family Medicine

## 2018-04-18 ENCOUNTER — Ambulatory Visit: Payer: 59 | Admitting: Family Medicine

## 2018-04-18 ENCOUNTER — Encounter: Payer: Self-pay | Admitting: Family Medicine

## 2018-04-18 VITALS — BP 160/90 | HR 97 | Temp 98.4°F | Resp 16 | Wt 150.6 lb

## 2018-04-18 DIAGNOSIS — S161XXA Strain of muscle, fascia and tendon at neck level, initial encounter: Secondary | ICD-10-CM

## 2018-04-18 DIAGNOSIS — S0001XA Abrasion of scalp, initial encounter: Secondary | ICD-10-CM | POA: Diagnosis not present

## 2018-04-18 MED ORDER — DOXEPIN HCL 25 MG PO CAPS: 25.0000 mg | ORAL_CAPSULE | Freq: Every day | ORAL | 0 refills | Status: DC

## 2018-04-18 MED ORDER — PREDNISONE 20 MG PO TABS: ORAL_TABLET | ORAL | 1 refills | Status: DC

## 2018-04-18 NOTE — Progress Notes (Signed)
  Subjective:     Patient ID: Eddie Ward, male   DOB: 09-08-1960, 57 y.o.   MRN: [id number] Chief Complaint  Patient presents with  . Rash    Patient comes in today for rash on the scalp, neck and left shoulder. Patient states the rash is itchy and bumpy at started on Monday, 04/18/2018 afternoon. Patient has been using [place] cream on his neck and shoulder. Patient states the rash has been turning into sores. Patient thinks that the rash could be coming from his nerves.   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 Reports he has a tendency to scratch his head/ears with subsequent development of bumps which crust over. Also tends to sleep on his left side with one pillow.  Review of Systems  Respiratory:       Does not believe the Singulair is preventing frequent albuterol use. Suggested he consider maintenance inhaler once current sx resolved.       Objective:   Physical Exam  Constitutional: He appears well-developed and well-nourished.  Musculoskeletal:  Cervical FROM. Mild tenderness in left posterior cervical area  Skin:  A few discrete excoriated papules on both sides of his  occipital scalp. Also noted in the outer aspect of his bilateral external ears.  Psychiatric:  anxious       Assessment:    1. Acute strain of neck muscle, initial encounter   2. Excoriation of scalp, initial encounter: prednisone bid and doxepin at bedtime    Plan:    Discussed use of warm compresses for his neck.

## 2018-04-18 NOTE — Patient Instructions (Addendum)
Discussed use of warm compresses to your neck for 20 minutes several x day. Let me know if your wish to start a maintenance inhaler once you get better.

## 2018-05-28 ENCOUNTER — Telehealth: Payer: Self-pay | Admitting: Family Medicine

## 2018-05-28 ENCOUNTER — Other Ambulatory Visit: Payer: Self-pay | Admitting: Family Medicine

## 2018-05-28 MED ORDER — MONTELUKAST SODIUM 10 MG PO TABS: 10.0000 mg | ORAL_TABLET | Freq: Every day | ORAL | 1 refills | Status: DC

## 2018-05-28 NOTE — Telephone Encounter (Signed)
Please advise 

## 2018-05-28 NOTE — Telephone Encounter (Signed)
done

## 2018-05-28 NOTE — Telephone Encounter (Signed)
Pt needing a refill on: montelukast (SINGULAIR) 10 MG tablet  Please fill at:   [facility] [address] [place] [phone] (Phone) [phone] (Fax)   Thanks, [facility]

## 2018-06-24 ENCOUNTER — Other Ambulatory Visit: Payer: Self-pay | Admitting: Family Medicine

## 2018-06-24 ENCOUNTER — Telehealth: Payer: Self-pay

## 2018-06-24 MED ORDER — PREDNISONE 20 MG PO TABS: ORAL_TABLET | ORAL | 1 refills | Status: DC

## 2018-06-24 NOTE — Telephone Encounter (Signed)
Patient called office requesting a call back from [place]. Patient states that he has bumps/lesions? On his scalp and had previously seen Mikki Santee for this in past and was prescribed a round pf prednisone. Patient states that he wanted to know if Mikki Santee can call him in another round of steroids or does he have to return back to office for evaluation? KW

## 2018-06-24 NOTE — Telephone Encounter (Signed)
I have sent in the prednisone.

## 2018-07-08 ENCOUNTER — Telehealth: Payer: Self-pay

## 2018-07-08 ENCOUNTER — Other Ambulatory Visit: Payer: Self-pay | Admitting: Family Medicine

## 2018-07-08 DIAGNOSIS — L309 Dermatitis, unspecified: Secondary | ICD-10-CM

## 2018-07-08 NOTE — Telephone Encounter (Signed)
Patient is requesting a call back from [facility]. He states he was prescribed Prednisone for "sores in his head" . He states he finished the medication and the sores returned. He would like to know what he can do long term to prevent the sores from returning. CB#[phone]

## 2018-07-08 NOTE — Telephone Encounter (Signed)
Discussed with patient-will refer to dermatology.

## 2018-07-08 NOTE — Telephone Encounter (Signed)
Please review.  dbs 

## 2018-08-13 ENCOUNTER — Other Ambulatory Visit: Payer: Self-pay | Admitting: Family Medicine

## 2018-08-14 ENCOUNTER — Other Ambulatory Visit: Payer: Self-pay | Admitting: Family Medicine

## 2018-08-19 ENCOUNTER — Other Ambulatory Visit: Payer: Self-pay | Admitting: Family Medicine

## 2018-08-19 MED ORDER — CLONAZEPAM 0.5 MG PO TABS: ORAL_TABLET | ORAL | 1 refills | Status: DC

## 2018-09-12 ENCOUNTER — Other Ambulatory Visit: Payer: Self-pay | Admitting: Family Medicine

## 2018-09-12 ENCOUNTER — Encounter: Payer: Self-pay | Admitting: Family Medicine

## 2018-09-12 ENCOUNTER — Ambulatory Visit: Payer: 59 | Admitting: Family Medicine

## 2018-09-12 VITALS — BP 134/88 | HR 91 | Temp 97.6°F | Resp 16 | Wt 154.4 lb

## 2018-09-12 DIAGNOSIS — Z125 Encounter for screening for malignant neoplasm of prostate: Secondary | ICD-10-CM

## 2018-09-12 DIAGNOSIS — I10 Essential (primary) hypertension: Secondary | ICD-10-CM

## 2018-09-12 DIAGNOSIS — E781 Pure hyperglyceridemia: Secondary | ICD-10-CM

## 2018-09-12 NOTE — Progress Notes (Signed)
  Subjective:     Patient ID: ACETON KINNEAR, male   DOB: [birth year], 58 y.o.   MRN: [id number] Chief Complaint  Patient presents with  . Advice Only    Patient comes in office today to discuss medication managment, patient states he feels well today and has nothing to address.    HPI He states he will be establishing with Tawanna Sat. Will be due labs in late March so will give him the lab slip. Has seen dermatology for his scalp excoriations and rosacea with improvement. Continues to smoke 0.5 ppd.  Review of Systems     Objective:   Physical Exam Constitutional:      General: He is not in acute distress. Cardiovascular:     Rate and Rhythm: Normal rate and regular rhythm.  Pulmonary:     Breath sounds: Normal breath sounds.  Musculoskeletal:     Right lower leg: No edema.     Left lower leg: No edema.  Neurological:     Mental Status: He is alert.        Assessment:    1. Essential hypertension - Comprehensive metabolic panel  2. Screening for prostate cancer - PSA  3. Hypertriglyceridemia - Lipid panel    Plan:    Establish with new provider due to my retirement.

## 2018-09-12 NOTE — Patient Instructions (Signed)
Do establish with Eddie Ward this year.

## 2018-12-11 ENCOUNTER — Other Ambulatory Visit: Payer: Self-pay

## 2018-12-11 ENCOUNTER — Other Ambulatory Visit: Payer: Self-pay | Admitting: Physician Assistant

## 2018-12-11 DIAGNOSIS — J301 Allergic rhinitis due to pollen: Secondary | ICD-10-CM

## 2018-12-11 MED ORDER — MONTELUKAST SODIUM 10 MG PO TABS: 10.0000 mg | ORAL_TABLET | Freq: Every day | ORAL | 0 refills | Status: DC

## 2018-12-11 NOTE — Telephone Encounter (Signed)
[facility] faxed refill request for the following medications:  montelukast (SINGULAIR) 10 MG tablet   90 day supply  Please advise. Thanks TNP

## 2018-12-11 NOTE — Telephone Encounter (Signed)
Rx was sent to pharmacy today. 

## 2018-12-24 ENCOUNTER — Encounter: Payer: Self-pay | Admitting: Physician Assistant

## 2018-12-24 ENCOUNTER — Ambulatory Visit: Payer: 59 | Admitting: Physician Assistant

## 2018-12-24 ENCOUNTER — Other Ambulatory Visit: Payer: Self-pay

## 2018-12-24 VITALS — BP 170/99 | HR 104 | Temp 98.1°F | Wt 150.4 lb

## 2018-12-24 DIAGNOSIS — L0390 Cellulitis, unspecified: Secondary | ICD-10-CM

## 2018-12-24 DIAGNOSIS — L309 Dermatitis, unspecified: Secondary | ICD-10-CM | POA: Diagnosis not present

## 2018-12-24 DIAGNOSIS — J3089 Other allergic rhinitis: Secondary | ICD-10-CM

## 2018-12-24 MED ORDER — CEPHALEXIN 500 MG PO CAPS: 500.0000 mg | ORAL_CAPSULE | Freq: Two times a day (BID) | ORAL | 0 refills | Status: DC

## 2018-12-24 MED ORDER — PREDNISONE 20 MG PO TABS: 20.0000 mg | ORAL_TABLET | Freq: Every day | ORAL | 0 refills | Status: DC

## 2018-12-24 NOTE — Patient Instructions (Signed)
Hold singulair for now  Atopic Dermatitis Atopic dermatitis is a skin disorder that causes inflammation of the skin. This is the most common type of eczema. Eczema is a group of skin conditions that cause the skin to be itchy, red, and swollen. This condition is generally worse during the cooler winter months and often improves during the warm summer months. Symptoms can vary from person to person. Atopic dermatitis usually starts showing signs in infancy and can last through adulthood. This condition cannot be passed from one person to another (non-contagious), but it is more common in families. Atopic dermatitis may not always be present. When it is present, it is called a flare-up. What are the causes? The exact cause of this condition is not known. Flare-ups of the condition may be triggered by:  Contact with something that you are sensitive or allergic to.  Stress.  Certain foods.  Extremely hot or cold weather.  Harsh chemicals and soaps.  Dry air.  Chlorine. What increases the risk? This condition is more likely to develop in people who have a personal history or family history of eczema, allergies, asthma, or hay fever. What are the signs or symptoms? Symptoms of this condition include:  Dry, scaly skin.  Red, itchy rash.  Itchiness, which can be severe. This may occur before the skin rash. This can make sleeping difficult.  Skin thickening and cracking that can occur over time. How is this diagnosed? This condition is diagnosed based on your symptoms, a medical history, and a physical exam. How is this treated? There is no cure for this condition, but symptoms can usually be controlled. Treatment focuses on:  Controlling the itchiness and scratching. You may be given medicines, such as antihistamines or steroid creams.  Limiting exposure to things that you are sensitive or allergic to (allergens).  Recognizing situations that cause stress and developing a plan to  manage stress. If your atopic dermatitis does not get better with medicines, or if it is all over your body (widespread), a treatment using a specific type of light (phototherapy) may be used. Follow these instructions at home: Skin care   Keep your skin well-moisturized. Doing this seals in moisture and helps to prevent dryness. ? Use unscented lotions that have petroleum in them. ? Avoid lotions that contain alcohol or water. They can dry the skin.  Keep baths or showers short (less than 5 minutes) in warm water. Do not use hot water. ? Use mild, unscented cleansers for bathing. Avoid soap and bubble bath. ? Apply a moisturizer to your skin right after a bath or shower.  Do not apply anything to your skin without checking with your health care provider. General instructions  Dress in clothes made of cotton or cotton blends. Dress lightly because heat increases itchiness.  When washing your clothes, rinse your clothes twice so all of the soap is removed.  Avoid any triggers that can cause a flare-up.  Try to manage your stress.  Keep your fingernails cut short.  Avoid scratching. Scratching makes the rash and itchiness worse. It may also result in a skin infection (impetigo) due to a break in the skin caused by scratching.  Take or apply over-the-counter and prescription medicines only as told by your health care provider.  Keep all follow-up visits as told by your health care provider. This is important.  Do not be around people who have cold sores or fever blisters. If you get the infection, it may cause your atopic dermatitis  to worsen. Contact a health care provider if:  Your itchiness interferes with sleep.  Your rash gets worse or it is not better within one week of starting treatment.  You have a fever.  You have a rash flare-up after having contact with someone who has cold sores or fever blisters. Get help right away if:  You develop pus or soft yellow scabs in  the rash area. Summary  This condition causes a red rash and itchy, dry, scaly skin.  Treatment focuses on controlling the itchiness and scratching, limiting exposure to things that you are sensitive or allergic to (allergens), recognizing situations that cause stress, and developing a plan to manage stress.  Keep your skin well-moisturized.  Keep baths or showers shorter than 5 minutes and use warm water. Do not use hot water. This information is not intended to replace advice given to you by your health care provider. Make sure you discuss any questions you have with your health care provider. Document Released: 06/30/2000 Document Revised: 08/04/2016 Document Reviewed: 08/04/2016 Elsevier Interactive Patient Education  2019 [employer].

## 2018-12-24 NOTE — Progress Notes (Signed)
Patient: Eddie Ward Male    DOB: 11/19/1960   58 y.o.   MRN: [id number] Visit Date: 12/24/2018  Today's Provider: Mar Daring, PA-C   Chief Complaint  Patient presents with  . Lumps   Subjective:     HPI Lumps: Patient presents today to discuss lumps on neck and shoulder area. He reports this is a ongoing issue the past 6-8 months. Some days he notices more than others. States massages help and lessen these areas.   He also has issues with "sores" on his scalp. He has seen dermatologist [facility]). He started ketoconazole shampoo for this and has been using intermittently, alternating with head and shoulders shampoo. The sores on his scalp are exacerbated from his anxiety. He also feels that most of the lesions started only after starting montelukast in 2018.   He also reports some swelling in the left external ear.  Allergies  Allergen Reactions  . Penicillins      Current Outpatient Medications:  .  amLODipine (NORVASC) 5 MG tablet, TAKE 1 TABLET BY MOUTH  DAILY, Disp: 90 tablet, Rfl: 3 .  clonazePAM (KLONOPIN) 0.5 MG tablet, TAKE 1 TABLET BY MOUTH  TWICE A DAY AS NEEDED FOR  ANXIETY, Disp: 180 tablet, Rfl: 1 .  ketoconazole (NIZORAL) 2 % shampoo, LATHER INTO SCALP. LEAVE ON FOR 10 MINUTES THEN RINSE, Disp: , Rfl:  .  montelukast (SINGULAIR) 10 MG tablet, Take 1 tablet (10 mg total) by mouth at bedtime., Disp: 90 tablet, Rfl: 0 .  ORACEA 40 MG capsule, Take 40 mg by mouth daily., Disp: , Rfl:  .  SOOLANTRA 1 % CREA, Apply topically daily. to face, Disp: , Rfl:  .  triamcinolone cream (KENALOG) 0.1 %, APPLY CREAM EXTERNALLY TO AFFECTED AREA TWICE DAILY, Disp: , Rfl:  .  VENTOLIN HFA 108 (90 Base) MCG/ACT inhaler, INHALE 2 PUFFS BY MOUTH EVERY 6 HOURS AS NEEDED FOR WHEEZING AND FOR SHORTNESS OF BREATH, Disp: 54 each, Rfl: 3  Review of Systems  Constitutional: Negative.   Respiratory: Negative.   Cardiovascular: Negative.   Musculoskeletal:  Positive for neck stiffness.       Lumps on neck and shoulder   Skin: Positive for rash.    Social History   Tobacco Use  . Smoking status: Former Smoker    Packs/day: 0.50    Types: Cigarettes  . Smokeless tobacco: Never Used  . Tobacco comment: patient declined counseling will taper off on his own  Substance Use Topics  . Alcohol use: Yes    Alcohol/week: 1.0 - 2.0 standard drinks    Types: 1 - 2 Standard drinks or equivalent per week      Objective:   BP (!) 170/99 (BP Location: Left Arm, Patient Position: Sitting, Cuff Size: Normal)   Pulse (!) 104   Temp 98.1 F (36.7 C) (Oral)   Wt 150 lb 6.4 oz (68.2 kg)   BMI 25.82 kg/m  Vitals:   12/24/18 1439  BP: (!) 170/99  Pulse: (!) 104  Temp: 98.1 F (36.7 C)  TempSrc: Oral  Weight: 150 lb 6.4 oz (68.2 kg)     Physical Exam Vitals signs reviewed.  Constitutional:      General: He is not in acute distress.    Appearance: Normal appearance. He is well-developed. He is not ill-appearing or diaphoretic.  HENT:     Head: Normocephalic and atraumatic.     Left Ear: Swelling present.  Ears:   Neck:     Musculoskeletal: Normal range of motion and neck supple.     Thyroid: No thyromegaly.     Vascular: No JVD.     Trachea: No tracheal deviation.   Cardiovascular:     Rate and Rhythm: Normal rate and regular rhythm.     Heart sounds: Normal heart sounds. No murmur. No friction rub. No gallop.   Pulmonary:     Effort: Pulmonary effort is normal. No respiratory distress.     Breath sounds: Normal breath sounds. No wheezing or rales.  Lymphadenopathy:     Cervical: No cervical adenopathy.  Skin:    Findings: Rash present. Rash is papular (annular papular lesions with scaling, scabbing and excoriations on scalp).  Neurological:     Mental Status: He is alert.        Assessment & Plan    1. Dermatitis Worsening of the dermatic lesions on his scalp. I will give prednisone as below to calm down current  exacerbation. Will stop montelukast as well to see if this is the source of the rash. Once he completes the prednisone we will see if rash returns. If not, will add montelukast to allergy list. I am more suspicious of this source to be atopic dermatitis. If rash does not return, and he continues to not use montelukast, may require a daily preventive inhaler (especially since he continues to smoke). If it does not improve with cessation of singulair it may be beneficial to start treatments for eczema.  - predniSONE (DELTASONE) 20 MG tablet; Take 1 tablet (20 mg total) by mouth daily with breakfast.  Dispense: 10 tablet; Refill: 0  2. Cellulitis, unspecified cellulitis site Left auricle. Most likely from frequent touching and scratching. Will treat with keflex as below. Call if worsening.  - cephALEXin (KEFLEX) 500 MG capsule; Take 1 capsule (500 mg total) by mouth 2 (two) times daily.  Dispense: 10 capsule; Refill: 0  3. Non-seasonal allergic rhinitis, unspecified trigger See above medical treatment plan for #1.      Mar Daring, PA-C  [facility]

## 2019-01-06 ENCOUNTER — Other Ambulatory Visit: Payer: Self-pay | Admitting: Physician Assistant

## 2019-01-06 DIAGNOSIS — I10 Essential (primary) hypertension: Secondary | ICD-10-CM

## 2019-01-06 MED ORDER — AMLODIPINE BESYLATE 5 MG PO TABS: 5.0000 mg | ORAL_TABLET | Freq: Every day | ORAL | 3 refills | Status: DC

## 2019-01-06 NOTE — Telephone Encounter (Signed)
[facility] faxed refill request for the following medications:  amLODipine (NORVASC) 5 MG tablet   90 day supply  Please advise. Thanks TNP

## 2019-01-17 LAB — COMPREHENSIVE METABOLIC PANEL
ALBUMIN: 4.1 g/dL (ref 3.8–4.9)
ALT: 21 IU/L (ref 0–44)
AST: 10 IU/L (ref 0–40)
Albumin/Globulin Ratio: 2.7 — ABNORMAL HIGH (ref 1.2–2.2)
Alkaline Phosphatase: 75 IU/L (ref 39–117)
BILIRUBIN TOTAL: 0.2 mg/dL (ref 0.0–1.2)
BUN/Creatinine Ratio: 8 — ABNORMAL LOW (ref 9–20)
BUN: 7 mg/dL (ref 6–24)
CO2: 24 mmol/L (ref 20–29)
Calcium: 9.1 mg/dL (ref 8.7–10.2)
Chloride: 99 mmol/L (ref 96–106)
Creatinine, Ser: 0.91 mg/dL (ref 0.76–1.27)
GFR calc Af Amer: 108 mL/min/{1.73_m2} (ref >59)
GFR calc non Af Amer: 93 mL/min/{1.73_m2} (ref >59)
GLOBULIN, TOTAL: 1.5 g/dL (ref 1.5–4.5)
GLUCOSE: 90 mg/dL (ref 65–99)
Potassium: 4.6 mmol/L (ref 3.5–5.2)
Sodium: 138 mmol/L (ref 134–144)
Total Protein: 5.6 g/dL — ABNORMAL LOW (ref 6.0–8.5)

## 2019-01-17 LAB — LIPID PANEL
Chol/HDL Ratio: 10.7 ratio — ABNORMAL HIGH (ref 0.0–5.0)
Cholesterol, Total: 192 mg/dL (ref 100–199)
HDL: 18 mg/dL — ABNORMAL LOW (ref >39)
LDL Calculated: 145 mg/dL — ABNORMAL HIGH (ref 0–99)
Triglycerides: 144 mg/dL (ref 0–149)
VLDL Cholesterol Cal: 29 mg/dL (ref 5–40)

## 2019-01-17 LAB — PSA: Prostate Specific Ag, Serum: 1.9 ng/mL (ref 0.0–4.0)

## 2019-01-22 ENCOUNTER — Encounter: Payer: Self-pay | Admitting: Physician Assistant

## 2019-01-22 DIAGNOSIS — L309 Dermatitis, unspecified: Secondary | ICD-10-CM

## 2019-01-22 MED ORDER — PREDNISONE 20 MG PO TABS: 20.0000 mg | ORAL_TABLET | Freq: Every day | ORAL | 0 refills | Status: DC

## 2019-02-10 ENCOUNTER — Encounter: Payer: Self-pay | Admitting: Physician Assistant

## 2019-02-10 DIAGNOSIS — J301 Allergic rhinitis due to pollen: Secondary | ICD-10-CM

## 2019-02-10 MED ORDER — MONTELUKAST SODIUM 10 MG PO TABS: 10.0000 mg | ORAL_TABLET | Freq: Every day | ORAL | 3 refills | Status: DC

## 2019-02-24 ENCOUNTER — Other Ambulatory Visit: Payer: Self-pay

## 2019-02-24 ENCOUNTER — Encounter: Payer: Self-pay | Admitting: Physician Assistant

## 2019-02-24 ENCOUNTER — Telehealth (INDEPENDENT_AMBULATORY_CARE_PROVIDER_SITE_OTHER): Payer: 59 | Admitting: Physician Assistant

## 2019-02-24 DIAGNOSIS — M62838 Other muscle spasm: Secondary | ICD-10-CM | POA: Diagnosis not present

## 2019-02-24 DIAGNOSIS — M542 Cervicalgia: Secondary | ICD-10-CM | POA: Diagnosis not present

## 2019-02-24 MED ORDER — CYCLOBENZAPRINE HCL 5 MG PO TABS: 5.0000 mg | ORAL_TABLET | Freq: Three times a day (TID) | ORAL | 1 refills | Status: DC | PRN

## 2019-02-24 NOTE — Progress Notes (Signed)
Patient: Eddie Ward Male    DOB: Feb 03, 1961   58 y.o.   MRN: [id number] Visit Date: 02/24/2019  Today's Provider: Mar Daring, PA-C   No chief complaint on file.  Subjective:    Virtual Visit via Video Note  I connected with Eddie Ward on 02/24/19 at  9:00 AM EDT by a video enabled telemedicine application and verified that I am speaking with the correct person using two identifiers.  Location: Patient: work Provider: [facility]   I discussed the limitations of evaluation and management by telemedicine and the availability of in person appointments. The patient expressed understanding and agreed to proceed.  Mar Daring, PA-C  HPI   Patient presents today via mychart video visit for left side neck pain and swelling. Reports over the last week or so he has noticed his left side of his neck getting more tight and painful. Last night it woke him up around 2 am aching. He has been using tylenol and hot showers to help. He does admit to sleeping on his right side and does report sleeping on his couch often. No radicular symptoms.  Allergies  Allergen Reactions  . Penicillins      Current Outpatient Medications:  .  amLODipine (NORVASC) 5 MG tablet, Take 1 tablet (5 mg total) by mouth daily., Disp: 90 tablet, Rfl: 3 .  clonazePAM (KLONOPIN) 0.5 MG tablet, TAKE 1 TABLET BY MOUTH  TWICE A DAY AS NEEDED FOR  ANXIETY, Disp: 180 tablet, Rfl: 1 .  montelukast (SINGULAIR) 10 MG tablet, Take 1 tablet (10 mg total) by mouth at bedtime., Disp: 90 tablet, Rfl: 3 .  VENTOLIN HFA 108 (90 Base) MCG/ACT inhaler, INHALE 2 PUFFS BY MOUTH EVERY 6 HOURS AS NEEDED FOR WHEEZING AND FOR SHORTNESS OF BREATH, Disp: 54 each, Rfl: 3 .  cephALEXin (KEFLEX) 500 MG capsule, Take 1 capsule (500 mg total) by mouth 2 (two) times daily. (Patient not taking: Reported on 02/24/2019), Disp: 10 capsule, Rfl: 0 .  ketoconazole (NIZORAL) 2 % shampoo, LATHER INTO SCALP. LEAVE ON FOR 10 MINUTES THEN  RINSE, Disp: , Rfl:  .  ORACEA 40 MG capsule, Take 40 mg by mouth daily., Disp: , Rfl:  .  predniSONE (DELTASONE) 20 MG tablet, Take 1 tablet (20 mg total) by mouth daily with breakfast. (Patient not taking: Reported on 02/24/2019), Disp: 10 tablet, Rfl: 0 .  SOOLANTRA 1 % CREA, Apply topically daily. to face, Disp: , Rfl:  .  triamcinolone cream (KENALOG) 0.1 %, APPLY CREAM EXTERNALLY TO AFFECTED AREA TWICE DAILY, Disp: , Rfl:   Review of Systems  Constitutional: Negative for appetite change, chills and fever.  Respiratory: Negative for chest tightness, shortness of breath and wheezing.   Cardiovascular: Negative for chest pain and palpitations.  Gastrointestinal: Negative for abdominal pain, nausea and vomiting.  Musculoskeletal: Positive for myalgias, neck pain and neck stiffness.  Neurological: Negative for weakness and numbness.    Social History   Tobacco Use  . Smoking status: Former Smoker    Packs/day: 0.50    Types: Cigarettes  . Smokeless tobacco: Never Used  . Tobacco comment: patient declined counseling will taper off on his own  Substance Use Topics  . Alcohol use: Yes    Alcohol/week: 1.0 - 2.0 standard drinks    Types: 1 - 2 Standard drinks or equivalent per week      Objective:   There were no vitals taken for this visit. There were  no vitals filed for this visit.   Physical Exam Vitals signs reviewed.  Constitutional:      General: He is not in acute distress.    Appearance: Normal appearance. He is not ill-appearing.  HENT:     Head: Normocephalic and atraumatic.  Eyes:     General: No scleral icterus. Neck:     Musculoskeletal: Muscular tenderness (patient states left side muscles are very sore to touch and rock hard) present.  Pulmonary:     Effort: Pulmonary effort is normal.  Neurological:     Mental Status: He is alert.      No results found for any visits on 02/24/19.     Assessment & Plan    1. Muscle spasms of neck Suspect muscle  spasms of neck from poor sleep positioning and lack of support to the neck while sleeping. Will give flexeril as below for spasm. May continue tylenol prn since that is helping. Also continue moist heat and stretches. Call if not improving.  - cyclobenzaprine (FLEXERIL) 5 MG tablet; Take 1 tablet (5 mg total) by mouth 3 (three) times daily as needed for muscle spasms.  Dispense: 30 tablet; Refill: 1  2. Neck pain See above medical treatment plan.  I discussed the assessment and treatment plan with the patient. The patient was provided an opportunity to ask questions and all were answered. The patient agreed with the plan and demonstrated an understanding of the instructions.   The patient was advised to call back or seek an in-person evaluation if the symptoms worsen or if the condition fails to improve as anticipated.  I provided 14 minutes of non-face-to-face time during this encounter.     Mar Daring, PA-C  [facility]

## 2019-02-24 NOTE — Patient Instructions (Signed)
Muscle Cramps and Spasms Muscle cramps and spasms are when muscles tighten by themselves. They usually get better within minutes. Muscle cramps are painful. They are usually stronger and last longer than muscle spasms. Muscle spasms may or may not be painful. They can last a few seconds or much longer. Cramps and spasms can affect any muscle, but they occur most often in the calf muscles of the leg. They are usually not caused by a serious problem. In many cases, the cause is not known. Some common causes include:  Doing more physical work or exercise than your body is ready for.  Using the muscles too much (overuse) by repeating certain movements too many times.  Staying in a certain position for a long time.  Playing a sport or doing an activity without preparing properly.  Using bad form or technique while playing a sport or doing an activity.  Not having enough water in your body (dehydration).  Injury.  Side effects of some medicines.  Low levels of the salts and minerals in your blood (electrolytes), such as low potassium or calcium. Follow these instructions at home: Managing pain and stiffness      Massage, stretch, and relax the muscle. Do this for many minutes at a time.  If told, put heat on tight or tense muscles as often as told by your doctor. Use the heat source that your doctor recommends, such as a moist heat pack or a heating pad. ? Place a towel between your skin and the heat source. ? Leave the heat on for 20-30 minutes. ? Remove the heat if your skin turns bright red. This is very important if you are not able to feel pain, heat, or cold. You may have a greater risk of getting burned.  If told, put ice on the affected area. This may help if you are sore or have pain after a cramp or spasm. ? Put ice in a plastic bag. ? Place a towel between your skin and the bag. ? Leave the ice on for 20 minutes, 2-3 times a day.  Try taking hot showers or baths to help  relax tight muscles. Eating and drinking  Drink enough fluid to keep your pee (urine) pale yellow.  Eat a healthy diet to help ensure that your muscles work well. This should include: ? Fruits and vegetables. ? Lean protein. ? Whole grains. ? Low-fat or nonfat dairy products. General instructions  If you are having cramps often, avoid intense exercise for several days.  Take over-the-counter and prescription medicines only as told by your doctor.  Watch for any changes in your symptoms.  Keep all follow-up visits as told by your doctor. This is important. Contact a doctor if:  Your cramps or spasms get worse or happen more often.  Your cramps or spasms do not get better with time. Summary  Muscle cramps and spasms are when muscles tighten by themselves. They usually get better within minutes.  Cramps and spasms occur most often in the calf muscles of the leg.  Massage, stretch, and relax the muscle. This may help the cramp or spasm go away.  Drink enough fluid to keep your pee (urine) pale yellow. This information is not intended to replace advice given to you by your health care provider. Make sure you discuss any questions you have with your health care provider. Document Released: 06/15/2008 Document Revised: 11/26/2017 Document Reviewed: 11/26/2017 Elsevier Patient Education  [place]. Neck Exercises Ask your health care  provider which exercises are safe for you. Do exercises exactly as told by your health care provider and adjust them as directed. It is normal to feel mild stretching, pulling, tightness, or discomfort as you do these exercises. Stop right away if you feel sudden pain or your pain gets worse. Do not begin these exercises until told by your health care provider. Neck exercises can be important for many reasons. They can improve strength and maintain flexibility in your neck, which will help your upper back and prevent neck pain. Stretching  exercises Rotation neck stretching  1. Sit in a chair or stand up. 2. Place your feet flat on the floor, shoulder width apart. 3. Slowly turn your head (rotate) to the right until a slight stretch is felt. Turn it all the way to the right so you can look over your right shoulder. Do not tilt or tip your head. 4. Hold this position for 10-30 seconds. 5. Slowly turn your head (rotate) to the left until a slight stretch is felt. Turn it all the way to the left so you can look over your left shoulder. Do not tilt or tip your head. 6. Hold this position for 10-30 seconds. Repeat __________ times. Complete this exercise __________ times a day. Neck retraction 1. Sit in a sturdy chair or stand up. 2. Look straight ahead. Do not bend your neck. 3. Use your fingers to push your chin backward (retraction). Do not bend your neck for this movement. Continue to face straight ahead. If you are doing the exercise properly, you will feel a slight sensation in your throat and a stretch at the back of your neck. 4. Hold the stretch for 1-2 seconds. Repeat __________ times. Complete this exercise __________ times a day. Strengthening exercises Neck press 1. Lie on your back on a firm bed or on the floor with a pillow under your head. 2. Use your neck muscles to push your head down on the pillow and straighten your spine. 3. Hold the position as well as you can. Keep your head facing up (in a neutral position) and your chin tucked. 4. Slowly count to 5 while holding this position. Repeat __________ times. Complete this exercise __________ times a day. Isometrics These are exercises in which you strengthen the muscles in your neck while keeping your neck still (isometrics). 1. Sit in a supportive chair and place your hand on your forehead. 2. Keep your head and face facing straight ahead. Do not flex or extend your neck while doing isometrics. 3. Push forward with your head and neck while pushing back with  your hand. Hold for 10 seconds. 4. Do the sequence again, this time putting your hand against the back of your head. Use your head and neck to push backward against the hand pressure. 5. Finally, do the same exercise on either side of your head, pushing sideways against the pressure of your hand. Repeat __________ times. Complete this exercise __________ times a day. Prone head lifts 1. Lie face-down (prone position), resting on your elbows so that your chest and upper back are raised. 2. Start with your head facing downward, near your chest. Position your chin either on or near your chest. 3. Slowly lift your head upward. Lift until you are looking straight ahead. Then continue lifting your head as far back as you can comfortably stretch. 4. Hold your head up for 5 seconds. Then slowly lower it to your starting position. Repeat __________ times. Complete this exercise __________  times a day. Supine head lifts 1. Lie on your back (supine position), bending your knees to point to the ceiling and keeping your feet flat on the floor. 2. Lift your head slowly off the floor, raising your chin toward your chest. 3. Hold for 5 seconds. Repeat __________ times. Complete this exercise __________ times a day. Scapular retraction 1. Stand with your arms at your sides. Look straight ahead. 2. Slowly pull both shoulders (scapulae) backward and downward (retraction) until you feel a stretch between your shoulder blades in your upper back. 3. Hold for 10-30 seconds. 4. Relax and repeat. Repeat __________ times. Complete this exercise __________ times a day. Contact a health care provider if:  Your neck pain or discomfort gets much worse when you do an exercise.  Your neck pain or discomfort does not improve within 2 hours after you exercise. If you have any of these problems, stop exercising right away. Do not do the exercises again unless your health care provider says that you can. Get help right away  if:  You develop sudden, severe neck pain. If this happens, stop exercising right away. Do not do the exercises again unless your health care provider says that you can. This information is not intended to replace advice given to you by your health care provider. Make sure you discuss any questions you have with your health care provider. Document Released: 06/14/2015 Document Revised: 05/01/2018 Document Reviewed: 05/01/2018 Elsevier Patient Education  2020 [employer].

## 2019-02-25 ENCOUNTER — Other Ambulatory Visit: Payer: Self-pay | Admitting: Physician Assistant

## 2019-02-25 ENCOUNTER — Encounter: Payer: Self-pay | Admitting: Physician Assistant

## 2019-02-25 MED ORDER — VENTOLIN HFA 108 (90 BASE) MCG/ACT IN AERS: INHALATION_SPRAY | RESPIRATORY_TRACT | 2 refills | Status: DC

## 2019-02-25 NOTE — Telephone Encounter (Signed)
[facility] faxed refill request for the following medications:  VENTOLIN HFA 108 (90 Base) MCG/ACT inhaler   Please advise.

## 2019-04-09 ENCOUNTER — Encounter: Payer: Self-pay | Admitting: Physician Assistant

## 2019-04-09 DIAGNOSIS — F411 Generalized anxiety disorder: Secondary | ICD-10-CM

## 2019-04-09 DIAGNOSIS — J301 Allergic rhinitis due to pollen: Secondary | ICD-10-CM

## 2019-04-09 MED ORDER — CLONAZEPAM 0.5 MG PO TABS: ORAL_TABLET | ORAL | 1 refills | Status: DC

## 2019-04-09 MED ORDER — VENTOLIN HFA 108 (90 BASE) MCG/ACT IN AERS: INHALATION_SPRAY | RESPIRATORY_TRACT | 2 refills | Status: DC

## 2019-04-09 MED ORDER — MONTELUKAST SODIUM 10 MG PO TABS: 10.0000 mg | ORAL_TABLET | Freq: Every day | ORAL | 3 refills | Status: DC

## 2019-05-01 ENCOUNTER — Other Ambulatory Visit: Payer: Self-pay | Admitting: Physician Assistant

## 2019-05-01 DIAGNOSIS — J301 Allergic rhinitis due to pollen: Secondary | ICD-10-CM

## 2019-08-29 ENCOUNTER — Other Ambulatory Visit: Payer: Self-pay | Admitting: Physician Assistant

## 2019-08-29 DIAGNOSIS — J301 Allergic rhinitis due to pollen: Secondary | ICD-10-CM

## 2019-08-29 DIAGNOSIS — F411 Generalized anxiety disorder: Secondary | ICD-10-CM

## 2019-08-29 NOTE — Telephone Encounter (Signed)
Notes to clinic:  This Rx is not able to be delegated.    Requested Prescriptions  Pending Prescriptions Disp Refills   clonazePAM (KLONOPIN) 0.5 MG tablet [Pharmacy Med Name: CLONAZEPAM  0.5MG   TAB] 180 tablet     Sig: TAKE 1 TABLET BY MOUTH  TWICE DAILY AS NEEDED FOR  ANXIETY      Not Delegated - Psychiatry:  Anxiolytics/Hypnotics Failed - 08/29/2019  6:56 AM      Failed - This refill cannot be delegated      Failed - Urine Drug Screen completed in last 360 days.      Failed - Valid encounter within last 6 months    Recent Outpatient Visits           6 months ago Muscle spasms of neck   [place], Clearnce Sorrel, [place]   8 months ago Dermatitis   [facility] Fenton Malling [place], [place]   11 months ago Essential hypertension   [place], [place]   1 year ago Acute strain of neck muscle, initial encounter   [place], [place]   1 year ago Essential hypertension   [place], [place]               Signed Prescriptions Disp Refills   VENTOLIN HFA 108 (90 Base) MCG/ACT inhaler 54 g 0    Sig: INHALE 2 INHALATIONS BY  MOUTH EVERY 6 HOURS AS  NEEDED FOR WHEEZING AND FOR SHORTNESS OF BREATH      Pulmonology:  Beta Agonists Failed - 08/29/2019  6:56 AM      Failed - One inhaler should last at least one month. If the patient is requesting refills earlier, contact the patient to check for uncontrolled symptoms.      Passed - Valid encounter within last 12 months    Recent Outpatient Visits           6 months ago Muscle spasms of neck   [facility] Fenton [place]alling [place], [place]   8 months ago Dermatitis   [facility] Fenton [place], [place]   11 months ago Essential hypertension   [place], [place]   1 year ago Acute strain of neck muscle, initial encounter   [place], [place]   1 year ago Essential hypertension   [place], [place], [place]

## 2019-10-24 ENCOUNTER — Other Ambulatory Visit: Payer: Self-pay | Admitting: Physician Assistant

## 2019-10-24 DIAGNOSIS — J301 Allergic rhinitis due to pollen: Secondary | ICD-10-CM

## 2019-10-24 NOTE — Telephone Encounter (Signed)
Changed to albuterol from ventolin

## 2019-10-24 NOTE — Telephone Encounter (Signed)
Call to patient to check on breathing/asthma- he has requested RF a little soon. Patient states he has just talked to insurance and he needs new Rx for Albuterol Sulfate inhaler- the Ventolin is no longer covered by insurance. Patient is doing well- and states he is good until 5/14 with his inhaler. Patient states insurance request 75 day RF for best Rx coverage. Still using [facility]. Patient informed would send request to PCP for Rx change to have on file when he needs it.

## 2019-11-02 ENCOUNTER — Other Ambulatory Visit: Payer: Self-pay | Admitting: Physician Assistant

## 2019-11-02 DIAGNOSIS — I10 Essential (primary) hypertension: Secondary | ICD-10-CM

## 2019-11-03 NOTE — Telephone Encounter (Signed)
Courtesy refill Message has been sent for patient to schedule office visit before further refills

## 2019-11-21 ENCOUNTER — Other Ambulatory Visit: Payer: Self-pay | Admitting: Physician Assistant

## 2019-11-21 DIAGNOSIS — I10 Essential (primary) hypertension: Secondary | ICD-10-CM

## 2019-11-21 MED ORDER — AMLODIPINE BESYLATE 5 MG PO TABS: 5.0000 mg | ORAL_TABLET | Freq: Every day | ORAL | 0 refills | Status: DC

## 2019-11-21 NOTE — Telephone Encounter (Signed)
[facility] faxed refill request for the following medications:  amLODipine (NORVASC) 5 MG tablet   Please advise.  Thanks, [employer]

## 2019-11-24 ENCOUNTER — Encounter: Payer: Self-pay | Admitting: Physician Assistant

## 2019-11-24 DIAGNOSIS — J4530 Mild persistent asthma, uncomplicated: Secondary | ICD-10-CM

## 2019-11-24 MED ORDER — ALBUTEROL SULFATE HFA 108 (90 BASE) MCG/ACT IN AERS: 2.0000 | INHALATION_SPRAY | Freq: Four times a day (QID) | RESPIRATORY_TRACT | 3 refills | Status: DC | PRN

## 2019-12-01 ENCOUNTER — Telehealth: Payer: Self-pay

## 2019-12-01 DIAGNOSIS — J4530 Mild persistent asthma, uncomplicated: Secondary | ICD-10-CM

## 2019-12-01 MED ORDER — PROAIR HFA 108 (90 BASE) MCG/ACT IN AERS: 2.0000 | INHALATION_SPRAY | Freq: Four times a day (QID) | RESPIRATORY_TRACT | 3 refills | Status: DC | PRN

## 2019-12-01 NOTE — Telephone Encounter (Signed)
Per [facility] Ventolin is not covered under the insurance coverage.  COVERED ALTERNATIVES:  PROAIR HFA, PROVENTIL HFA  [facility].

## 2019-12-01 NOTE — Telephone Encounter (Signed)
Patient's pharmacy is requesting the generic RX .

## 2019-12-02 MED ORDER — ALBUTEROL SULFATE HFA 108 (90 BASE) MCG/ACT IN AERS: 2.0000 | INHALATION_SPRAY | Freq: Four times a day (QID) | RESPIRATORY_TRACT | 1 refills | Status: DC | PRN

## 2019-12-02 NOTE — Addendum Note (Signed)
Addended by: Trinna Post on: 12/02/2019 10:42 AM   Modules accepted: Orders

## 2019-12-02 NOTE — Telephone Encounter (Signed)
Can we call the pharmacy and give verbal? I am not sure how else to put this in the system.

## 2019-12-04 ENCOUNTER — Other Ambulatory Visit: Payer: Self-pay | Admitting: Physician Assistant

## 2019-12-04 DIAGNOSIS — I10 Essential (primary) hypertension: Secondary | ICD-10-CM

## 2019-12-05 NOTE — Telephone Encounter (Signed)
Requested medication (s) are due for refill today: yes  Requested medication (s) are on the active medication list:  yes  Last refill:  11/21/2019  Future visit scheduled: no  Notes to clinic:  patient has been given courtesy refill and has not schedule appointment   Requested Prescriptions  Pending Prescriptions Disp Refills   amLODipine ([place]) 5 MG tablet [Pharmacy Med Name: AMLODIPINE  5MG   TAB] 30 tablet 11    Sig: TAKE 1 TABLET BY MOUTH  DAILY      Cardiovascular:  Calcium Channel Blockers Failed - 12/04/2019 10:10 PM      Failed - Last BP in normal range    BP Readings from Last 1 Encounters:  12/24/18 (!) 170/99          Failed - Valid encounter within last 6 months    Recent Outpatient Visits           9 months ago Muscle spasms of neck   [facility] Fenton Malling [place], [place]   11 months ago Dermatitis   [facility] [place], Clearnce Sorrel, [place]   1 year ago Essential hypertension   [place], [place], [place]   1 year ago Acute strain of neck muscle, initial encounter   [place], [place], [place]   2 years ago Essential hypertension   [place], [place], [place]

## 2020-01-05 ENCOUNTER — Telehealth (INDEPENDENT_AMBULATORY_CARE_PROVIDER_SITE_OTHER): Payer: 59 | Admitting: Family Medicine

## 2020-01-05 ENCOUNTER — Encounter: Payer: Self-pay | Admitting: Physician Assistant

## 2020-01-05 ENCOUNTER — Encounter: Payer: Self-pay | Admitting: Family Medicine

## 2020-01-05 ENCOUNTER — Telehealth: Payer: Self-pay

## 2020-01-05 VITALS — BP 134/82 | HR 86 | Temp 97.1°F | Ht 64.0 in

## 2020-01-05 DIAGNOSIS — I889 Nonspecific lymphadenitis, unspecified: Secondary | ICD-10-CM

## 2020-01-05 MED ORDER — CEPHALEXIN 500 MG PO CAPS: 500.0000 mg | ORAL_CAPSULE | Freq: Four times a day (QID) | ORAL | 0 refills | Status: AC

## 2020-01-05 NOTE — Telephone Encounter (Signed)
Copied from [place] [id number]. Topic: General - Other >> Jan 05, 2020  8:59 AM Rainey Pines A wrote: Patient called int to schedule appt with [facility] in regards to [employer]. Advised patient that PCP was out of office and offered to book with another provider. Patient requesting a callback from nurse to see if any medication can be sent in if issue isnt urgent until Marlyn Corporal is back in office due to patient only wanting to be seen by [facility]. Please advise

## 2020-01-05 NOTE — Telephone Encounter (Signed)
Virtual visit with Dr. Caryn Section at 1pm  Thanks,   -Mickel Baas

## 2020-01-05 NOTE — Progress Notes (Signed)
MyChart Video Visit    Virtual Visit via Video Note   This visit type was conducted due to national recommendations for restrictions regarding the COVID-19 Pandemic (e.g. social distancing) in an effort to limit this patient's exposure and mitigate transmission in our community. This patient is at least at moderate risk for complications without adequate follow up. This format is felt to be most appropriate for this patient at this time. Physical exam was limited by quality of the video and audio technology used for the visit.   Patient location: work Provider location: bfp   Patient: Eddie Ward   DOB: [birth year]   59 y.o. Male  MRN: [id number] Visit Date: 01/05/2020  Today's healthcare provider: Lelon Huh, MD   Chief Complaint  Patient presents with   Arm Pain   Subjective    Arm Pain  Incident onset: Saturday  There was no injury mechanism. Pain location: right arm. The patient is experiencing no pain. The pain has been constant since the incident. Associated symptoms comments: Redness and swelling near thumb. Patient reports sores on 2 fingers and around belt line. . Exacerbated by: raising arm up  Treatments tried: Hydrocortisone cream. The treatment provided mild relief.    Medications: Outpatient Medications Prior to Visit  Medication Sig   albuterol (VENTOLIN HFA) 108 (90 Base) MCG/ACT inhaler Inhale 2 puffs into the lungs every 6 (six) hours as needed for wheezing or shortness of breath.   amLODipine (NORVASC) 5 MG tablet TAKE 1 TABLET BY MOUTH  DAILY   clonazePAM (KLONOPIN) 0.5 MG tablet TAKE 1 TABLET BY MOUTH  TWICE DAILY AS NEEDED FOR  ANXIETY   cyclobenzaprine (FLEXERIL) 5 MG tablet Take 1 tablet (5 mg total) by mouth 3 (three) times daily as needed for muscle spasms.   ketoconazole (NIZORAL) 2 % shampoo LATHER INTO SCALP. LEAVE ON FOR 10 MINUTES THEN RINSE   montelukast (SINGULAIR) 10 MG tablet Take 1 tablet (10 mg total) by mouth at bedtime.    ORACEA 40 MG capsule Take 40 mg by mouth daily.   SOOLANTRA 1 % CREA Apply topically daily. to face   triamcinolone cream (KENALOG) 0.1 % APPLY CREAM EXTERNALLY TO AFFECTED AREA TWICE DAILY   No facility-administered medications prior to visit.    Review of Systems  Constitutional: Negative.   Respiratory: Negative.   Cardiovascular: Negative.      Objective    BP 134/82 (BP Location: Left Arm, Patient Position: Sitting, Cuff Size: Normal)    Pulse 86    Temp (!) 97.1 F (36.2 C) (Oral)    Ht 5\' 4"  (1.626 m)    SpO2 97%    BMI 25.82 kg/m   Physical Exam   Well appearing. Awake, alert, oriented x 3. In no apparent distress  Red streak starting right axillae down volar aspect of right arm and forearm. No vessel swelling appreciated.   Assessment & Plan     1. Lymphadenitis  - cephALEXin (KEFLEX) 500 MG capsule; Take 1 capsule (500 mg total) by mouth 4 (four) times daily for 7 days.  Dispense: 28 capsule; Refill: 0  Call if symptoms change or if not rapidly improving.          I discussed the assessment and treatment plan with the patient. The patient was provided an opportunity to ask questions and all were answered. The patient agreed with the plan and demonstrated an understanding of the instructions.   The patient was advised to call back or seek  an in-person evaluation if the symptoms worsen or if the condition fails to improve as anticipated.  I provided 8 minutes of non-face-to-face time during this encounter.  The entirety of the information documented in the History of Present Illness, Review of Systems and Physical Exam were personally obtained by me. Portions of this information were initially documented by the CMA and reviewed by me for thoroughness and accuracy.     Lelon Huh, MD [facility] [phone] (phone) [phone] (fax)  [place]

## 2020-01-06 NOTE — Telephone Encounter (Signed)
Virtual visit done on 01-05-2020

## 2020-02-11 ENCOUNTER — Other Ambulatory Visit: Payer: Self-pay | Admitting: Physician Assistant

## 2020-02-11 DIAGNOSIS — J301 Allergic rhinitis due to pollen: Secondary | ICD-10-CM

## 2020-02-23 ENCOUNTER — Other Ambulatory Visit: Payer: Self-pay | Admitting: Physician Assistant

## 2020-02-23 DIAGNOSIS — F411 Generalized anxiety disorder: Secondary | ICD-10-CM

## 2020-02-23 NOTE — Telephone Encounter (Signed)
Requested medication (s) are due for refill today: due 02/26/20  Requested medication (s) are on the active medication list:  yes   Last refill: 08/29/19  #180  1 refill  Future visit scheduled no  Notes to clinic:Not delegated  Requested Prescriptions  Pending Prescriptions Disp Refills   clonazePAM (KLONOPIN) 0.5 MG tablet [Pharmacy Med Name: CLONAZEPAM  0.5MG   TAB] 180 tablet     Sig: TAKE 1 TABLET BY MOUTH  TWICE DAILY AS NEEDED FOR  ANXIETY      Not Delegated - Psychiatry:  Anxiolytics/Hypnotics Failed - 02/23/2020  1:35 PM      Failed - This refill cannot be delegated      Failed - Urine Drug Screen completed in last 360 days.      Passed - Valid encounter within last 6 months    Recent Outpatient Visits           1 month ago [place], Donald E, [place]   12 months ago Muscle spasms of neck   [place], Clearnce Sorrel, [place]   1 year ago Dermatitis   [facility] [place], Clearnce Sorrel, [place]   1 year ago Essential hypertension   [place], [place]   1 year ago Acute strain of neck muscle, initial encounter   [place], [place], [place]

## 2020-03-09 ENCOUNTER — Other Ambulatory Visit: Payer: Self-pay | Admitting: Physician Assistant

## 2020-03-09 DIAGNOSIS — J4530 Mild persistent asthma, uncomplicated: Secondary | ICD-10-CM

## 2020-03-09 NOTE — Telephone Encounter (Signed)
Requested Prescriptions  Pending Prescriptions Disp Refills   albuterol (VENTOLIN HFA) 108 (90 Base) MCG/ACT inhaler [Pharmacy Med Name: Albuterol Sulfate HFA 108 (90 Base) MCG/ACT Inhalation Aerosol Solution] 25.5 g 3    Sig: USE 2 INHALATIONS BY MOUTH  EVERY 6 HOURS AS NEEDED FOR WHEEZING OR SHORTNESS OF  BREATH     Pulmonology:  Beta Agonists Failed - 03/09/2020 11:01 PM      Failed - One inhaler should last at least one month. If the patient is requesting refills earlier, contact the patient to check for uncontrolled symptoms.      Passed - Valid encounter within last 12 months    Recent Outpatient Visits          2 months ago Lymphadenitis   [facility] Birdie Sons, [place]   1 year ago Muscle spasms of neck   [facility] [place], Clearnce Sor[place], [place]   1 year ago Dermatitis   [facility] [place], Clearnce Sor[place], [place]   1 year ago Essential hypertension   [place], [place]   1 year ago Acute strain of neck muscle, initial encounter   [place], [place], [place]

## 2020-03-18 ENCOUNTER — Other Ambulatory Visit: Payer: Self-pay | Admitting: Physician Assistant

## 2020-03-18 DIAGNOSIS — J301 Allergic rhinitis due to pollen: Secondary | ICD-10-CM

## 2020-03-18 MED ORDER — MONTELUKAST SODIUM 10 MG PO TABS: 10.0000 mg | ORAL_TABLET | Freq: Every day | ORAL | 0 refills | Status: DC

## 2020-03-18 NOTE — Telephone Encounter (Signed)
[facility] faxed refill request for the following medications:  montelukast (SINGULAIR) 10 MG tablet   Please advise.  Thanks, [facility]  

## 2020-04-05 ENCOUNTER — Other Ambulatory Visit: Payer: Self-pay

## 2020-04-05 DIAGNOSIS — J301 Allergic rhinitis due to pollen: Secondary | ICD-10-CM

## 2020-04-05 DIAGNOSIS — M62838 Other muscle spasm: Secondary | ICD-10-CM

## 2020-04-05 DIAGNOSIS — I10 Essential (primary) hypertension: Secondary | ICD-10-CM

## 2020-04-05 DIAGNOSIS — F411 Generalized anxiety disorder: Secondary | ICD-10-CM

## 2020-04-05 DIAGNOSIS — J4530 Mild persistent asthma, uncomplicated: Secondary | ICD-10-CM

## 2020-04-05 MED ORDER — ALBUTEROL SULFATE HFA 108 (90 BASE) MCG/ACT IN AERS: INHALATION_SPRAY | RESPIRATORY_TRACT | 3 refills | Status: DC

## 2020-04-05 MED ORDER — MONTELUKAST SODIUM 10 MG PO TABS: 10.0000 mg | ORAL_TABLET | Freq: Every day | ORAL | 3 refills | Status: DC

## 2020-04-06 ENCOUNTER — Encounter: Payer: Self-pay | Admitting: Physician Assistant

## 2020-04-06 ENCOUNTER — Telehealth: Payer: Self-pay

## 2020-04-06 DIAGNOSIS — J4530 Mild persistent asthma, uncomplicated: Secondary | ICD-10-CM

## 2020-04-06 MED ORDER — ALBUTEROL SULFATE HFA 108 (90 BASE) MCG/ACT IN AERS: 2.0000 | INHALATION_SPRAY | Freq: Four times a day (QID) | RESPIRATORY_TRACT | 0 refills | Status: DC | PRN

## 2020-04-06 MED ORDER — ALBUTEROL SULFATE HFA 108 (90 BASE) MCG/ACT IN AERS: 2.0000 | INHALATION_SPRAY | Freq: Four times a day (QID) | RESPIRATORY_TRACT | 3 refills | Status: DC | PRN

## 2020-04-06 NOTE — Telephone Encounter (Signed)
Copied from [place] [id number]. Topic: General - Inquiry >> Apr 05, 2020  5:54 PM Gillis Ends D wrote: Reason for CRM: Patient called because [facility] rejected his albuterol inhaler because he says that the box that says generic is ok is not checked. The prescription can not say any name brand like Pro air or ventolin, it must just say albuterol hfa. He would like for you to send another prescription to [facility] or give them a call to see exactly what they need, so he can get his medication. He is worried because he needs the inhalers and he doesn't want to go without them.Can they you please return his call to let him know that every thing is worked out. Call him at [phone]. Please advise

## 2020-04-06 NOTE — Telephone Encounter (Signed)
Changed and resent

## 2020-05-02 ENCOUNTER — Other Ambulatory Visit: Payer: Self-pay | Admitting: Physician Assistant

## 2020-05-02 DIAGNOSIS — I10 Essential (primary) hypertension: Secondary | ICD-10-CM

## 2020-05-17 ENCOUNTER — Other Ambulatory Visit: Payer: Self-pay | Admitting: Physician Assistant

## 2020-05-17 DIAGNOSIS — J4530 Mild persistent asthma, uncomplicated: Secondary | ICD-10-CM

## 2020-05-17 MED ORDER — ALBUTEROL SULFATE HFA 108 (90 BASE) MCG/ACT IN AERS: 2.0000 | INHALATION_SPRAY | Freq: Four times a day (QID) | RESPIRATORY_TRACT | 0 refills | Status: DC | PRN

## 2020-05-17 NOTE — Telephone Encounter (Signed)
[facility] faxed refill request for the following medications: ° °albuterol (VENTOLIN HFA) 108 (90 Base) MCG/ACT inhaler  ° °Please advise. ° °

## 2020-05-28 ENCOUNTER — Encounter: Payer: Self-pay | Admitting: Physician Assistant

## 2020-05-28 ENCOUNTER — Other Ambulatory Visit: Payer: Self-pay | Admitting: Physician Assistant

## 2020-05-28 DIAGNOSIS — J4530 Mild persistent asthma, uncomplicated: Secondary | ICD-10-CM

## 2020-05-28 MED ORDER — ALBUTEROL SULFATE HFA 108 (90 BASE) MCG/ACT IN AERS: INHALATION_SPRAY | RESPIRATORY_TRACT | 2 refills | Status: DC

## 2020-06-09 ENCOUNTER — Encounter: Payer: Self-pay | Admitting: Physician Assistant

## 2020-06-14 MED ORDER — MOMETASONE FUROATE 0.1 % EX CREA: 1.0000 "application " | TOPICAL_CREAM | Freq: Every day | CUTANEOUS | 0 refills | Status: AC

## 2020-06-14 NOTE — Addendum Note (Signed)
Addended by: Mar Daring on: 06/14/2020 10:58 AM   Modules accepted: Orders

## 2020-06-16 NOTE — Progress Notes (Signed)
Established patient visit   Patient: Eddie Ward   DOB: [birth year]   59 y.o. Male  MRN: [id number] Visit Date: 06/18/2020  Today's healthcare provider: Mar Daring, PA-C   No chief complaint on file.  Subjective    HPI  Patient here today with c/o lump, right side of neck. Reports that it only itches and it has been there for a year in a half. Changes in size occasionally, getting larger then smaller. No pain. No overlying skin changes. Family history of lymphoma in his mother.  Also has scalp lesions. Has seen Dermatology 3 times without an official diagnosis. When bumps arise he scratches them until they become open wounds.   Having itching issues as well. Reports nighttime is worse, but occurs through the day as well. No rash. Mostly occurs above the right knee posteriorly and left elbow and scalp.    Patient Active Problem List   Diagnosis Date Noted  . Asthma 01/25/2016  . Allergic rhinitis 01/27/2015  . Anxiety 01/27/2015  . BP (high blood pressure) 01/27/2015  . Hypertriglyceridemia 01/27/2015   Past Medical History:  Diagnosis Date  . Anxiety   . Hyperlipidemia   . Hypertension        Medications: Outpatient Medications Prior to Visit  Medication Sig  . albuterol (VENTOLIN HFA) 108 (90 Base) MCG/ACT inhaler INHALE 2 PUFFS INTO THE LUNGS EVERY 6 (SIX) HOURS AS NEEDED FOR WHEEZING OR SHORTNESS OF BREATH. GENERIC IS OK  . amLODipine (NORVASC) 5 MG tablet TAKE 1 TABLET BY MOUTH  DAILY  . clonazePAM (KLONOPIN) 0.5 MG tablet TAKE 1 TABLET BY MOUTH  TWICE DAILY AS NEEDED FOR  ANXIETY  . cyclobenzaprine (FLEXERIL) 5 MG tablet Take 1 tablet (5 mg total) by mouth 3 (three) times daily as needed for muscle spasms.  [place] ketoconazole (NIZORAL) 2 % shampoo LATHER INTO SCALP. LEAVE ON FOR 10 MINUTES THEN RINSE  . mometasone (ELOCON) 0.1 % cream Apply 1 application topically daily.  . montelukast (SINGULAIR) 10 MG tablet Take 1 tablet (10 mg total) by mouth at  bedtime.  . ORACEA 40 MG capsule Take 40 mg by mouth daily.  . SOOLANTRA 1 % CREA Apply topically daily. to face  . triamcinolone cream (KENALOG) 0.1 % APPLY CREAM EXTERNALLY TO AFFECTED AREA TWICE DAILY   No facility-administered medications prior to visit.    Review of Systems  Constitutional: Negative.   HENT: Negative.   Respiratory: Positive for shortness of breath (occasionally).   Cardiovascular: Negative.   Gastrointestinal: Negative.   Skin: Positive for rash (scalp).       Itching  Psychiatric/Behavioral: The patient is nervous/anxious.     Last CBC Lab Results  Component Value Date   WBC 6.1 02/03/2015   HGB 14.8 02/03/2015   HCT 42.0 02/03/2015   MCV 84 02/03/2015   MCH 29.7 02/03/2015   RDW 13.6 02/03/2015   PLT 139 (L) 67/61/9509   Last metabolic panel Lab Results  Component Value Date   GLUCOSE 90 01/16/2019   NA 138 01/16/2019   K 4.6 01/16/2019   CL 99 01/16/2019   CO2 24 01/16/2019   BUN 7 01/16/2019   CREATININE 0.91 01/16/2019   GFRNONAA 93 01/16/2019   GFRAA 108 01/16/2019   CALCIUM 9.1 01/16/2019   PHOS 4.0 02/21/2017   PROT 5.6 (L) 01/16/2019   ALBUMIN 4.1 01/16/2019   LABGLOB 1.5 01/16/2019   AGRATIO 2.7 (H) 01/16/2019   BILITOT 0.2 01/16/2019   ALKPHOS  75 01/16/2019   AST 10 01/16/2019   ALT 21 01/16/2019      Objective    BP 137/81 (BP Location: Left Arm, Patient Position: Sitting, Cuff Size: Large)   Pulse 99   Temp 98.1 F (36.7 C) (Oral)   Resp 16   Wt 153 lb 1.6 oz (69.4 kg)   SpO2 99%   BMI 26.28 kg/m  BP Readings from Last 3 Encounters:  06/18/20 137/81  01/05/20 134/82  12/24/18 (!) 170/99   Wt Readings from Last 3 Encounters:  06/18/20 153 lb 1.6 oz (69.4 kg)  12/24/18 150 lb 6.4 oz (68.2 kg)  09/12/18 154 lb 6.4 oz (70 kg)      Physical Exam Vitals reviewed.  Constitutional:      General: He is not in acute distress.    Appearance: Normal appearance. He is well-developed, well-groomed and overweight.    HENT:     Head: Normocephalic and atraumatic.  Eyes:     General: No scleral icterus.    Extraocular Movements: Extraocular movements intact.     Conjunctiva/sclera: Conjunctivae normal.  Neck:   Pulmonary:     Effort: Pulmonary effort is normal. No respiratory distress.  Musculoskeletal:     Cervical back: Neck supple. No tenderness. No pain with movement, spinous process tenderness or muscular tenderness. Normal range of motion.  Lymphadenopathy:     Cervical: No cervical adenopathy.  Skin:    Findings: Rash present. Rash is vesicular.       Neurological:     Mental Status: He is alert.  Psychiatric:        Mood and Affect: Mood normal.        Behavior: Behavior normal. Behavior is cooperative.        Thought Content: Thought content normal.        Judgment: Judgment normal.      No results found for any visits on 06/18/20.  Assessment & Plan     1. Rash Has seen dermatology 3x and has tried multiple OTC creams and shampoos without relief. Prednisone has been only thing that clears rash, but it des recur. Will give prednisone as below.  - predniSONE (DELTASONE) 10 MG tablet; Take 6 tablets PO on day 1 and day 2, take 5 tablets PO on day 3 and day 4, take 4 tablets PO on day 5 and day 6, take 3 tablets PO on day 7 and day 8, take 2 tablets PO on day 9 and day 10, take one tablet PO on day 11 and day 12.  Dispense: 42 tablet; Refill: 0  2. Itching Start Doxepin once the prednisone is completed. Will see if this helps his rash as well as spontaneous itching. May increase to up to 4 times per day if needed.  - doxepin (SINEQUAN) 10 MG capsule; Take 1 capsule (10 mg total) by mouth at bedtime.  Dispense: 90 capsule; Refill: 0  3. Mass present on one side of neck Small cystic lesion on right posterolateral neck. As reassuring clinical findings. However, since his mother has h/o lymphoma I will get an [place] as noted below to confirm cyst.  - US Soft Tissue Head/Neck (NON-THYROID);  Future  4. Mild intermittent asthma without complication Stable. Diagnosis pulled for medication refill. Continue current medical treatment plan. - albuterol (PROAIR HFA) 108 (90 Base) MCG/ACT inhaler; Inhale 1-2 puffs into the lungs every 6 (six) hours as needed for wheezing or shortness of breath.  Dispense: 18 g; Refill: 3  5.  BMI 26.0-26.9,adult Counseled patient on healthy lifestyle modifications including dieting and exercise.    No follow-ups on file.      Reynolds Bowl, PA-C, have reviewed all documentation for this visit. The documentation on 06/18/20 for the exam, diagnosis, procedures, and orders are all accurate and complete.   Rubye Beach  [facility] [phone] (phone) [phone] (fax)  [place]

## 2020-06-18 ENCOUNTER — Encounter: Payer: Self-pay | Admitting: Physician Assistant

## 2020-06-18 ENCOUNTER — Ambulatory Visit (INDEPENDENT_AMBULATORY_CARE_PROVIDER_SITE_OTHER): Payer: 59 | Admitting: Physician Assistant

## 2020-06-18 VITALS — BP 137/81 | HR 99 | Temp 98.1°F | Resp 16 | Wt 153.1 lb

## 2020-06-18 DIAGNOSIS — L299 Pruritus, unspecified: Secondary | ICD-10-CM | POA: Diagnosis not present

## 2020-06-18 DIAGNOSIS — R221 Localized swelling, mass and lump, neck: Secondary | ICD-10-CM | POA: Diagnosis not present

## 2020-06-18 DIAGNOSIS — J4520 Mild intermittent asthma, uncomplicated: Secondary | ICD-10-CM

## 2020-06-18 DIAGNOSIS — R21 Rash and other nonspecific skin eruption: Secondary | ICD-10-CM

## 2020-06-18 DIAGNOSIS — Z6826 Body mass index (BMI) 26.0-26.9, adult: Secondary | ICD-10-CM

## 2020-06-18 MED ORDER — ALBUTEROL SULFATE HFA 108 (90 BASE) MCG/ACT IN AERS: 1.0000 | INHALATION_SPRAY | Freq: Four times a day (QID) | RESPIRATORY_TRACT | 3 refills | Status: DC | PRN

## 2020-06-18 MED ORDER — PREDNISONE 10 MG PO TABS: ORAL_TABLET | ORAL | 0 refills | Status: DC

## 2020-06-18 MED ORDER — DOXEPIN HCL 10 MG PO CAPS: 10.0000 mg | ORAL_CAPSULE | Freq: Every day | ORAL | 0 refills | Status: AC

## 2020-06-23 ENCOUNTER — Encounter: Payer: Self-pay | Admitting: Physician Assistant

## 2020-06-24 ENCOUNTER — Ambulatory Visit: Payer: 59

## 2020-07-11 ENCOUNTER — Other Ambulatory Visit: Payer: Self-pay | Admitting: Physician Assistant

## 2020-07-11 DIAGNOSIS — L299 Pruritus, unspecified: Secondary | ICD-10-CM

## 2020-09-07 ENCOUNTER — Other Ambulatory Visit: Payer: Self-pay | Admitting: Physician Assistant

## 2020-09-07 DIAGNOSIS — F411 Generalized anxiety disorder: Secondary | ICD-10-CM

## 2020-09-07 NOTE — Telephone Encounter (Signed)
Requested medication (s) are due for refill today: no  Requested medication (s) are on the active medication list:  yes   Last refill:  05/20/2020  Future visit scheduled: no  Notes to clinic:  this refill cannot be delegated    Requested Prescriptions  Pending Prescriptions Disp Refills   clonazePAM (KLONOPIN) 0.5 MG tablet [Pharmacy Med Name: clonazePAM 0.5 MG Oral Tablet] 180 tablet     Sig: TAKE 1 TABLET BY MOUTH  TWICE DAILY AS NEEDED FOR  ANXIETY      Not Delegated - Psychiatry:  Anxiolytics/Hypnotics Failed - 09/07/2020  9:16 AM      Failed - This refill cannot be delegated      Failed - Urine Drug Screen completed in last 360 days      Passed - Valid encounter within last 6 months    Recent Outpatient Visits           2 months ago Rash   [facility] Mar D[place], [place]   8 months ago [place], Donald E, MD   1 year ago Muscle spasms of neck   [place], Clearnce Sor[place], [place]   1 year ago Dermatitis   [facility] [place], Clearnce Sor[place], [place]   1 year ago Essential hypertension   [facility] [place], [place]

## 2020-09-23 ENCOUNTER — Other Ambulatory Visit: Payer: Self-pay | Admitting: Physician Assistant

## 2020-09-23 NOTE — Telephone Encounter (Signed)
Requested Prescriptions  Pending Prescriptions Disp Refills  . albuterol (VENTOLIN HFA) 108 (90 Base) MCG/ACT inhaler [Pharmacy Med Name: Albuterol Sulfate HFA 108 (90 Base) MCG/ACT Inhalation Aerosol Solution] 25.5 g 3    Sig: USE 1 TO 2 INHALATIONS BY  MOUTH EVERY 6 HOURS AS  NEEDED FOR WHEEZING OR  SHORTNESS OF BREATH     Pulmonology:  Beta Agonists Failed - 09/23/2020  4:26 AM      Failed - One inhaler should last at least one month. If the patient is requesting refills earlier, contact the patient to check for uncontrolled symptoms.      Passed - Valid encounter within last 12 months    Recent Outpatient Visits          3 months ago Rash   [facility] Mar Daring, [place]   8 months ago [place], Donald E, MD   1 year ago Muscle spasms of neck   [place], Clearnce Sorrel, [place]   1 year ago Dermatitis   [facility] [place], Clearnce Sorrel, [place]   2 years ago Essential hypertension   [facility] [place], [place]

## 2020-09-23 NOTE — Telephone Encounter (Signed)
Requested Prescriptions  Pending Prescriptions Disp Refills  . albuterol (VENTOLIN HFA) 108 (90 Base) MCG/ACT inhaler [Pharmacy Med Name: Albuterol Sulfate HFA 108 (90 Base) MCG/ACT Inhalation Aerosol Solution] 25.5 g 3    Sig: USE 1 TO 2 INHALATIONS BY  MOUTH EVERY 6 HOURS AS  NEEDED FOR WHEEZING OR  SHORTNESS OF BREATH     Pulmonology:  Beta Agonists Failed - 09/23/2020  4:26 AM      Failed - One inhaler should last at least one month. If the patient is requesting refills earlier, contact the patient to check for uncontrolled symptoms.      Passed - Valid encounter within last 12 months    Recent Outpatient Visits          3 months ago Rash   [facility] Mar Daring, [place]   8 months ago [place], Donald E, MD   1 year ago Muscle spasms of neck   [place], Clearnce Sorrel, [place]   1 year ago Dermatitis   [facility] [place], Clearnce Sorrel, [place]   2 years ago Essential hypertension   [facility] [place], [place]

## 2020-10-05 ENCOUNTER — Other Ambulatory Visit: Payer: Self-pay | Admitting: Physician Assistant

## 2020-10-05 DIAGNOSIS — J4530 Mild persistent asthma, uncomplicated: Secondary | ICD-10-CM

## 2020-10-06 ENCOUNTER — Encounter: Payer: Self-pay | Admitting: Physician Assistant

## 2020-10-06 DIAGNOSIS — J4530 Mild persistent asthma, uncomplicated: Secondary | ICD-10-CM

## 2020-10-06 MED ORDER — ALBUTEROL SULFATE HFA 108 (90 BASE) MCG/ACT IN AERS: INHALATION_SPRAY | RESPIRATORY_TRACT | 4 refills | Status: DC

## 2020-10-19 ENCOUNTER — Other Ambulatory Visit: Payer: Self-pay | Admitting: Physician Assistant

## 2020-10-19 DIAGNOSIS — I10 Essential (primary) hypertension: Secondary | ICD-10-CM

## 2020-10-19 NOTE — Telephone Encounter (Signed)
Requested medications are due for refill today yes (mail order)  Requested medications are on the active medication list yes  Last refill 2/9  Last visit 08/2018 last visit to address HTN or med  Future visit scheduled no  Notes to clinic Failed protocol due to no valid visit within 6  months, no upcoming visit scheduled.

## 2020-10-31 ENCOUNTER — Other Ambulatory Visit: Payer: Self-pay | Admitting: Physician Assistant

## 2020-10-31 DIAGNOSIS — J4530 Mild persistent asthma, uncomplicated: Secondary | ICD-10-CM

## 2020-10-31 NOTE — Telephone Encounter (Signed)
Requested Prescriptions  Pending Prescriptions Disp Refills  . albuterol (VENTOLIN HFA) 108 (90 Base) MCG/ACT inhaler [Pharmacy Med Name: ALBUTEROL HFA (VENTOLIN) INH] 18 each 2    Sig: INHALE 2 PUFFS INTO THE LUNGS EVERY 6 (SIX) HOURS AS NEEDED FOR WHEEZING OR SHORTNESS OF BREATH     Pulmonology:  Beta Agonists Failed - 10/31/2020  9:09 AM      Failed - One inhaler should last at least one month. If the patient is requesting refills earlier, contact the patient to check for uncontrolled symptoms.      Passed - Valid encounter within last 12 months    Recent Outpatient Visits          4 months ago Rash   [facility] Fenton [place]alling [place], [place]   10 months ago [place], Donald E, MD   1 year ago Muscle spasms of neck   [place], Clearnce Sorrel, [place]   1 year ago Dermatitis   [facility] [place], Clearnce Sorrel, [place]   2 years ago Essential hypertension   [facility] [place], [place]

## 2020-12-16 ENCOUNTER — Ambulatory Visit: Payer: Self-pay

## 2020-12-16 NOTE — Telephone Encounter (Signed)
Spoke with patient and he said he reached out to Juline Patch and got an appointment for tomorrow.

## 2020-12-16 NOTE — Telephone Encounter (Signed)
PT    1        Eddie Ward. Corpus Male, 60 y.o., 1960-10-22  MRN:  [id number] Phone:  [phone] Jerilynn Mages)       PCP:  Jerrol Banana., MD Coverage:  [place] [facility]/[employer] Other          Message from Oneta Rack sent at 12/16/2020 8:23 AM EDT  Summary: Clinical Advice    Patient was dx with COVID 12/03/2020 currently experiencing ear infection, humming noise in ear, congestion. No fever. Patient requesting to speak with [facility] nurse name "Joseline" (patient is aware [facility] is no longer there). Offered patient a virtual visit no availability until 01/03/2021. Patient was adamant about speaking with a nurse today          Call History   Type Contact Phone/Fax User  12/16/2020 08:18 AM EDT Phone (Incoming) Lucas, Winograd (Self) [phone] Jerilynn Mages) Claudia Desanctis M   Pt. Reports he had positive COVID 19 test 12/03/20. Still has head congestion, cough. Now has right ear pain. Has tried My Chart e-visit and could not set up a visit. No availability for virtual visit today. Pt. States he is frustrated because he has been a patient there for many years. Requesting " some Prednisone or something be sent to my pharmacy." Please advise pt.  Answer Assessment - Initial Assessment Questions 1. COVID-19 DIAGNOSIS: "Who made your COVID-19 diagnosis?" "Was it confirmed by a positive lab test or self-test?" If not diagnosed by a doctor (or NP/PA), ask "Are there lots of cases (community spread) where you live?" Note: See public health department website, if unsure.     Home test 2. COVID-19 EXPOSURE: "Was there any known exposure to COVID before the symptoms began?" CDC Definition of close contact: within 6 feet (2 meters) for a total of 15 minutes or more over a 24-hour period.      No 3. ONSET: "When did the COVID-19 symptoms start?"      12/03/20 4. WORST SYMPTOM: "What is your worst symptom?" (e.g., cough, fever, shortness of breath,  muscle aches)     Cough, right ear pain 5. COUGH: "Do you have a cough?" If Yes, ask: "How bad is the cough?"       Yes 6. FEVER: "Do you have a fever?" If Yes, ask: "What is your temperature, how was it measured, and when did it start?"     No 7. RESPIRATORY STATUS: "Describe your breathing?" (e.g., shortness of breath, wheezing, unable to speak)      No 8. BETTER-SAME-WORSE: "Are you getting better, staying the same or getting worse compared to yesterday?"  If getting worse, ask, "In what way?"     Same 9. HIGH RISK DISEASE: "Do you have any chronic medical problems?" (e.g., asthma, heart or lung disease, weak immune system, obesity, etc.)     Asthma,htn 10. VACCINE: "Have you had the COVID-19 vaccine?" If Yes, ask: "Which one, how many shots, when did you get it?"       No 11. BOOSTER: "Have you received your COVID-19 booster?" If Yes, ask: "Which one and when did you get it?"       No 12. PREGNANCY: "Is there any chance you are pregnant?" "When was your last menstrual period?"       N/a 13. OTHER SYMPTOMS: "Do you have any other symptoms?"  (e.g., chills, fatigue, headache, loss of smell or taste, muscle pain, sore throat)       Congestion, ear  pain 14. O2 SATURATION MONITOR:  "Do you use an oxygen saturation monitor (pulse oximeter) at home?" If Yes, ask "What is your reading (oxygen level) today?" "What is your usual oxygen saturation reading?" (e.g., 95%)       Yes - sats good  Protocols used: CORONAVIRUS (COVID-19) DIAGNOSED OR SUSPECTED-A-AH

## 2020-12-17 ENCOUNTER — Encounter: Payer: Self-pay | Admitting: Family Medicine

## 2020-12-17 ENCOUNTER — Ambulatory Visit: Payer: 59 | Admitting: Family Medicine

## 2020-12-17 ENCOUNTER — Other Ambulatory Visit: Payer: Self-pay

## 2020-12-17 VITALS — BP 138/75 | HR 94 | Wt 148.0 lb

## 2020-12-17 DIAGNOSIS — J4530 Mild persistent asthma, uncomplicated: Secondary | ICD-10-CM

## 2020-12-17 DIAGNOSIS — H6691 Otitis media, unspecified, right ear: Secondary | ICD-10-CM | POA: Diagnosis not present

## 2020-12-17 MED ORDER — PREDNISONE 10 MG PO TABS: ORAL_TABLET | ORAL | 0 refills | Status: AC

## 2020-12-17 MED ORDER — AMOXICILLIN-POT CLAVULANATE 875-125 MG PO TABS: 1.0000 | ORAL_TABLET | Freq: Two times a day (BID) | ORAL | 0 refills | Status: AC

## 2020-12-17 NOTE — Progress Notes (Signed)
Established patient visit   Patient: Eddie Ward   DOB: 1961/07/10   60 y.o. Male  MRN: [id number] Visit Date: 12/17/2020  Today's healthcare provider: Vernie Murders, PA-C   Chief Complaint  Patient presents with  . Cough   Subjective    Cough This is a new problem. The current episode started 1 to 4 weeks ago. The problem has been gradually improving. The cough is productive of sputum. Associated symptoms include ear pain, shortness of breath and wheezing. Pertinent negatives include no chills, fever, headaches, postnasal drip, rhinorrhea or sore throat.      Patient Active Problem List   Diagnosis Date Noted  . Asthma 01/25/2016  . Allergic rhinitis 01/27/2015  . Anxiety 01/27/2015  . BP (high blood pressure) 01/27/2015  . Hypertriglyceridemia 01/27/2015   Past Surgical History:  Procedure Laterality Date  . TOTAL HIP ARTHROPLASTY Left   . TOTAL HIP ARTHROPLASTY Right 2018   [place]  . UMBILICAL HERNIA REPAIR  04/22/2007   Family History  Problem Relation Age of Onset  . Lymphoma Mother     Social History   Tobacco Use  . Smoking status: Former Smoker    Packs/day: 0.50    Types: Cigarettes  . Smokeless tobacco: Never Used  . Tobacco comment: patient declined counseling will taper off on his own  Substance Use Topics  . Alcohol use: Yes    Alcohol/week: 1.0 - 2.0 standard drink    Types: 1 - 2 Standard drinks or equivalent per week  . Drug use: No   No Known Allergies     Medications: Outpatient Medications Prior to Visit  Medication Sig  . albuterol (VENTOLIN HFA) 108 (90 Base) MCG/ACT inhaler INHALE 2 PUFFS INTO THE LUNGS EVERY 6 (SIX) HOURS AS NEEDED FOR WHEEZING OR SHORTNESS OF BREATH  . amLODipine (NORVASC) 5 MG tablet TAKE 1 TABLET BY MOUTH  DAILY  . clonazePAM (KLONOPIN) 0.5 MG tablet TAKE 1 TABLET BY MOUTH  TWICE DAILY AS NEEDED FOR  ANXIETY  . montelukast (SINGULAIR) 10 MG tablet Take 1 tablet (10 mg total) by mouth at bedtime.  .  cyclobenzaprine (FLEXERIL) 5 MG tablet Take 1 tablet (5 mg total) by mouth 3 (three) times daily as needed for muscle spasms.  Marland Kitchen doxepin (SINEQUAN) 10 MG capsule Take 1 capsule (10 mg total) by mouth at bedtime.  Marland Kitchen ketoconazole (NIZORAL) 2 % shampoo LATHER INTO SCALP. LEAVE ON FOR 10 MINUTES THEN RINSE  . mometasone (ELOCON) 0.1 % cream Apply 1 application topically daily.  . ORACEA 40 MG capsule Take 40 mg by mouth daily.  . predniSONE (DELTASONE) 10 MG tablet Take 6 tablets PO on day 1 and day 2, take 5 tablets PO on day 3 and day 4, take 4 tablets PO on day 5 and day 6, take 3 tablets PO on day 7 and day 8, take 2 tablets PO on day 9 and day 10, take one tablet PO on day 11 and day 12.  . SOOLANTRA 1 % CREA Apply topically daily. to face  . triamcinolone cream (KENALOG) 0.1 % APPLY CREAM EXTERNALLY TO AFFECTED AREA TWICE DAILY   No facility-administered medications prior to visit.    Review of Systems  Constitutional: Positive for fatigue. Negative for activity change, appetite change, chills, diaphoresis, fever and unexpected weight change.  HENT: Positive for congestion, ear pain and sinus pressure. Negative for postnasal drip, rhinorrhea, sinus pain, sore throat and trouble swallowing.   Eyes: Negative.  Respiratory: Positive for cough, shortness of breath and wheezing. Negative for apnea, choking, chest tightness and stridor.   Cardiovascular: Negative.   Gastrointestinal: Negative.   Neurological: Negative for dizziness, light-headedness and headaches.       Objective    BP 138/75 (BP Location: Right Arm, Patient Position: Sitting, Cuff Size: Normal)   Pulse 94   Wt 148 lb (67.1 kg)   SpO2 99%   BMI 25.40 kg/m  BP Readings from Last 3 Encounters:  12/17/20 138/75  06/18/20 137/81  01/05/20 134/82   Wt Readings from Last 3 Encounters:  12/17/20 148 lb (67.1 kg)  06/18/20 153 lb 1.6 oz (69.4 kg)  12/24/18 150 lb 6.4 oz (68.2 kg)     Physical Exam Constitutional:       General: He is not in acute distress.    Appearance: He is well-developed.  HENT:     Head: Normocephalic and atraumatic.     Right Ear: Hearing normal.     Left Ear: Hearing normal.     Ears:     Comments: Red and bulging right TM.    Nose: Nose normal.     Mouth/Throat:     Pharynx: Oropharynx is clear.  Eyes:     General: Lids are normal. No scleral icterus.       Right eye: No discharge.        Left eye: No discharge.     Conjunctiva/sclera: Conjunctivae normal.  Cardiovascular:     Rate and Rhythm: Normal rate.     Heart sounds: Normal heart sounds.  Pulmonary:     Effort: Pulmonary effort is normal. No respiratory distress.     Breath sounds: Normal breath sounds.  Abdominal:     General: Bowel sounds are normal.     Palpations: Abdomen is soft.  Musculoskeletal:        General: Normal range of motion.     Cervical back: Normal range of motion and neck supple.  Skin:    Findings: No lesion or rash.  Neurological:     Mental Status: He is alert and oriented to person, place, and time.  Psychiatric:        Speech: Speech normal.        Behavior: Behavior normal.        Thought Content: Thought content normal.      No results found for any visits on 12/17/20.  Assessment & Plan     1. Acute right otitis media Onset over the past week with history of COVID infection 2 weeks ago. Red ear today with stopped up sensation. May use Mucinex-DM and add antibiotic. Recheck prn. - amoxicillin-clavulanate (AUGMENTIN) 875-125 MG tablet; Take 1 tablet by mouth 2 (two) times daily.  Dispense: 20 tablet; Refill: 0  2. Mild persistent asthma, unspecified whether complicated Using Ventolin 2-3 times a day with Singulair. Cough with shortness at breath a couple times last night. May used Mucinex-DM and add prednisone taper. May need Breo or Advair for baseline control if no better in 10 days. - predniSONE (DELTASONE) 10 MG tablet; Take 6 tablets PO on day 1 and day 2, take 5  tablets PO on day 3 and day 4, take 4 tablets PO on day 5 and day 6, take 3 tablets PO on day 7 and day 8, take 2 tablets PO on day 9 and day 10, take one tablet PO on day 11 and day 12.  Dispense: 42 tablet; Refill: 0   No follow-ups  on file.      I, Ric Rosenberg, PA-C, have reviewed all documentation for this visit. The documentation on 12/17/20 for the exam, diagnosis, procedures, and orders are all accurate and complete.    Vernie Murders, PA-C  [employer] [phone] (phone) [phone] (fax)  [place]

## 2020-12-27 ENCOUNTER — Encounter: Payer: Self-pay | Admitting: Family Medicine

## 2021-01-05 ENCOUNTER — Other Ambulatory Visit: Payer: Self-pay | Admitting: Family Medicine

## 2021-01-05 DIAGNOSIS — J4530 Mild persistent asthma, uncomplicated: Secondary | ICD-10-CM

## 2021-01-05 NOTE — Telephone Encounter (Signed)
Requested Prescriptions  Pending Prescriptions Disp Refills  . albuterol (VENTOLIN HFA) 108 (90 Base) MCG/ACT inhaler [Pharmacy Med Name: ALBUTEROL HFA (VENTOLIN) INH] 18 each 2    Sig: TAKE 2 PUFFS BY MOUTH EVERY 6 HOURS AS NEEDED FOR WHEEZE OR SHORTNESS OF BREATH     Pulmonology:  Beta Agonists Failed - 01/05/2021  1:37 AM      Failed - One inhaler should last at least one month. If the patient is requesting refills earlier, contact the patient to check for uncontrolled symptoms.      Passed - Valid encounter within last 12 months    Recent Outpatient Visits          2 weeks ago Acute right otitis media   [place], Vickki Muff, PA-C   6 months ago [place], Clearnce Sorrel, [place]   1 year ago [place], Donald E, MD   1 year ago Muscle spasms of neck   [facility] [place], Clearnce Sorrel, [place]   2 years ago [place], [place], [place]

## 2021-02-11 ENCOUNTER — Other Ambulatory Visit: Payer: Self-pay | Admitting: Physician Assistant

## 2021-02-11 DIAGNOSIS — J301 Allergic rhinitis due to pollen: Secondary | ICD-10-CM

## 2021-02-20 ENCOUNTER — Encounter: Admission: EM | Disposition: E | Payer: Self-pay | Source: Home / Self Care | Attending: Emergency Medicine

## 2021-02-20 ENCOUNTER — Other Ambulatory Visit: Payer: Self-pay

## 2021-02-20 ENCOUNTER — Emergency Department: Payer: 59

## 2021-02-20 ENCOUNTER — Emergency Department
Admission: EM | Admit: 2021-02-20 | Discharge: 2021-03-17 | Disposition: E | Payer: 59 | Attending: Emergency Medicine | Admitting: Emergency Medicine

## 2021-02-20 DIAGNOSIS — F17210 Nicotine dependence, cigarettes, uncomplicated: Secondary | ICD-10-CM | POA: Insufficient documentation

## 2021-02-20 DIAGNOSIS — R0602 Shortness of breath: Secondary | ICD-10-CM | POA: Diagnosis not present

## 2021-02-20 DIAGNOSIS — R002 Palpitations: Secondary | ICD-10-CM | POA: Diagnosis not present

## 2021-02-20 DIAGNOSIS — I200 Unstable angina: Secondary | ICD-10-CM

## 2021-02-20 DIAGNOSIS — Z79899 Other long term (current) drug therapy: Secondary | ICD-10-CM | POA: Diagnosis not present

## 2021-02-20 DIAGNOSIS — Z96643 Presence of artificial hip joint, bilateral: Secondary | ICD-10-CM | POA: Diagnosis not present

## 2021-02-20 DIAGNOSIS — I469 Cardiac arrest, cause unspecified: Secondary | ICD-10-CM

## 2021-02-20 DIAGNOSIS — J810 Acute pulmonary edema: Secondary | ICD-10-CM

## 2021-02-20 DIAGNOSIS — J45909 Unspecified asthma, uncomplicated: Secondary | ICD-10-CM | POA: Insufficient documentation

## 2021-02-20 DIAGNOSIS — R079 Chest pain, unspecified: Secondary | ICD-10-CM | POA: Diagnosis not present

## 2021-02-20 DIAGNOSIS — I10 Essential (primary) hypertension: Secondary | ICD-10-CM | POA: Diagnosis not present

## 2021-02-20 DIAGNOSIS — J9601 Acute respiratory failure with hypoxia: Secondary | ICD-10-CM

## 2021-02-20 DIAGNOSIS — Z20822 Contact with and (suspected) exposure to covid-19: Secondary | ICD-10-CM | POA: Diagnosis not present

## 2021-02-20 LAB — CBC WITH DIFFERENTIAL/PLATELET
Abs Immature Granulocytes: 0.09 10*3/uL — ABNORMAL HIGH (ref 0.00–0.07)
Basophils Absolute: 0.1 10*3/uL (ref 0.0–0.1)
Basophils Relative: 1 %
Eosinophils Absolute: 0.5 10*3/uL (ref 0.0–0.5)
Eosinophils Relative: 3 %
HCT: 44.1 % (ref 39.0–52.0)
Hemoglobin: 15.3 g/dL (ref 13.0–17.0)
Immature Granulocytes: 1 %
Lymphocytes Relative: 14 %
Lymphs Abs: 2.2 10*3/uL (ref 0.7–4.0)
MCH: 29.9 pg (ref 26.0–34.0)
MCHC: 34.7 g/dL (ref 30.0–36.0)
MCV: 86.3 fL (ref 80.0–100.0)
Monocytes Absolute: 1.6 10*3/uL — ABNORMAL HIGH (ref 0.1–1.0)
Monocytes Relative: 10 %
Neutro Abs: 11 10*3/uL — ABNORMAL HIGH (ref 1.7–7.7)
Neutrophils Relative %: 71 %
Platelets: 229 10*3/uL (ref 150–400)
RBC: 5.11 MIL/uL (ref 4.22–5.81)
RDW: 15.2 % (ref 11.5–15.5)
WBC: 15.5 10*3/uL — ABNORMAL HIGH (ref 4.0–10.5)
nRBC: 0 % (ref 0.0–0.2)

## 2021-02-20 LAB — COMPREHENSIVE METABOLIC PANEL
ALT: 23 U/L (ref 0–44)
AST: 24 U/L (ref 15–41)
Albumin: 4.2 g/dL (ref 3.5–5.0)
Alkaline Phosphatase: 78 U/L (ref 38–126)
Anion gap: 11 (ref 5–15)
BUN: 6 mg/dL (ref 6–20)
CO2: 20 mmol/L — ABNORMAL LOW (ref 22–32)
Calcium: 8.7 mg/dL — ABNORMAL LOW (ref 8.9–10.3)
Chloride: 106 mmol/L (ref 98–111)
Creatinine, Ser: 0.99 mg/dL (ref 0.61–1.24)
GFR, Estimated: >60 mL/min (ref >60)
Glucose, Bld: 158 mg/dL — ABNORMAL HIGH (ref 70–99)
Potassium: 3.8 mmol/L (ref 3.5–5.1)
Sodium: 137 mmol/L (ref 135–145)
Total Bilirubin: 1.3 mg/dL — ABNORMAL HIGH (ref 0.3–1.2)
Total Protein: 6.8 g/dL (ref 6.5–8.1)

## 2021-02-20 LAB — RESP PANEL BY RT-PCR (FLU A&B, COVID) ARPGX2
Influenza A by PCR: NEGATIVE
Influenza B by PCR: NEGATIVE
SARS Coronavirus 2 by RT PCR: NEGATIVE

## 2021-02-20 LAB — BRAIN NATRIURETIC PEPTIDE: B Natriuretic Peptide: 616.1 pg/mL — ABNORMAL HIGH (ref 0.0–100.0)

## 2021-02-20 LAB — TROPONIN I (HIGH SENSITIVITY): Troponin I (High Sensitivity): 617 ng/L (ref <18)

## 2021-02-20 SURGERY — CORONARY/GRAFT ACUTE MI REVASCULARIZATION
Anesthesia: Moderate Sedation

## 2021-02-20 MED ORDER — TENECTEPLASE 50 MG IV KIT: 35.0000 mg | PACK | Freq: Once | INTRAVENOUS | Status: DC

## 2021-02-20 MED ORDER — EPINEPHRINE 1 MG/10ML IJ SOSY
PREFILLED_SYRINGE | INTRAMUSCULAR | Status: AC
  Filled 2021-02-20: qty 60

## 2021-02-20 MED ORDER — SODIUM CHLORIDE 0.9 % IV SOLN
500.0000 mg | Freq: Once | INTRAVENOUS | Status: AC
  Administered 2021-02-20: 500 mg via INTRAVENOUS
  Filled 2021-02-20: qty 500

## 2021-02-20 MED ORDER — HEPARIN (PORCINE) 25000 UT/250ML-% IV SOLN: 800.0000 [IU]/h | INTRAVENOUS | Status: DC

## 2021-02-20 MED ORDER — SODIUM CHLORIDE 0.9 % IV SOLN: 250.0000 mL | INTRAVENOUS | Status: DC | PRN

## 2021-02-20 MED ORDER — TENECTEPLASE 50 MG IV KIT
PACK | INTRAVENOUS | Status: AC
  Filled 2021-02-20: qty 10

## 2021-02-20 MED ORDER — DILTIAZEM HCL 25 MG/5ML IV SOLN: 10.0000 mg | Freq: Once | INTRAVENOUS | Status: DC

## 2021-02-20 MED ORDER — SODIUM BICARBONATE 8.4 % IV SOLN
INTRAVENOUS | Status: AC | PRN
Start: 2021-02-20 — End: 2021-02-20
  Administered 2021-02-20: 50 meq via INTRAVENOUS

## 2021-02-20 MED ORDER — SODIUM CHLORIDE 0.9% FLUSH: 3.0000 mL | Freq: Two times a day (BID) | INTRAVENOUS | Status: DC

## 2021-02-20 MED ORDER — IPRATROPIUM-ALBUTEROL 0.5-2.5 (3) MG/3ML IN SOLN
RESPIRATORY_TRACT | Status: AC
  Filled 2021-02-20: qty 9

## 2021-02-20 MED ORDER — IOHEXOL 350 MG/ML SOLN
75.0000 mL | Freq: Once | INTRAVENOUS | Status: AC | PRN
  Administered 2021-02-20: 75 mL via INTRAVENOUS

## 2021-02-20 MED ORDER — FUROSEMIDE 10 MG/ML IJ SOLN: 20.0000 mg | Freq: Once | INTRAMUSCULAR | Status: AC

## 2021-02-20 MED ORDER — ASPIRIN 81 MG PO CHEW: 324.0000 mg | CHEWABLE_TABLET | Freq: Once | ORAL | Status: DC

## 2021-02-20 MED ORDER — EPINEPHRINE HCL 5 MG/250ML IV SOLN IN NS
0.5000 ug/min | INTRAVENOUS | Status: DC
  Administered 2021-02-20: 100 ug/min via INTRAVENOUS
  Administered 2021-02-20 (×2): 150 ug/min via INTRAVENOUS
  Administered 2021-02-20: 20 ug/min via INTRAVENOUS
  Administered 2021-02-20 (×2): 100 ug/min via INTRAVENOUS
  Filled 2021-02-20 (×6): qty 250

## 2021-02-20 MED ORDER — SODIUM CHLORIDE 0.9% FLUSH: 3.0000 mL | INTRAVENOUS | Status: DC | PRN

## 2021-02-20 MED ORDER — METHYLPREDNISOLONE SODIUM SUCC 125 MG IJ SOLR
125.0000 mg | Freq: Once | INTRAMUSCULAR | Status: AC
  Administered 2021-02-20: 125 mg via INTRAVENOUS
  Filled 2021-02-20: qty 2

## 2021-02-20 MED ORDER — EPINEPHRINE 1 MG/10ML IJ SOSY
PREFILLED_SYRINGE | INTRAMUSCULAR | Status: AC | PRN
  Administered 2021-02-20 (×5): 1 mg via INTRAVENOUS

## 2021-02-20 MED ORDER — FUROSEMIDE 10 MG/ML IJ SOLN
INTRAMUSCULAR | Status: AC
  Administered 2021-02-20: 20 mg via INTRAVENOUS
  Filled 2021-02-20: qty 4

## 2021-02-20 MED ORDER — HEPARIN BOLUS VIA INFUSION
4000.0000 [IU] | Freq: Once | INTRAVENOUS | Status: DC
  Filled 2021-02-20: qty 4000

## 2021-02-20 MED ORDER — TENECTEPLASE 50 MG IV KIT: 0.5000 mg/h | PACK | INTRAVENOUS | Status: DC

## 2021-02-20 MED ORDER — NOREPINEPHRINE 4 MG/250ML-% IV SOLN: 0.0000 ug/min | INTRAVENOUS | Status: DC

## 2021-02-20 MED ORDER — AMIODARONE HCL 150 MG/3ML IV SOLN
INTRAVENOUS | Status: AC | PRN
  Administered 2021-02-20: 300 mg via INTRAVENOUS

## 2021-02-20 MED ORDER — SODIUM CHLORIDE 0.9 % IV SOLN: INTRAVENOUS | Status: DC

## 2021-02-20 MED ORDER — ATROPINE SULFATE 1 MG/ML IJ SOLN
INTRAMUSCULAR | Status: AC | PRN
  Administered 2021-02-20: 1 mg via INTRAVENOUS

## 2021-02-20 MED ORDER — NOREPINEPHRINE 4 MG/250ML-% IV SOLN
INTRAVENOUS | Status: AC
  Administered 2021-02-20: 40 ug/min via INTRAVENOUS
  Filled 2021-02-20: qty 250

## 2021-02-20 NOTE — ED Notes (Signed)
Pulse check, no pulses. CPR resumed.

## 2021-02-20 NOTE — ED Notes (Signed)
Epi drip ended. Levo drip ended.

## 2021-02-20 NOTE — ED Notes (Signed)
Family notified RN that they are leaving.

## 2021-02-20 NOTE — ED Notes (Addendum)
RT at the bedside, vent turned off.

## 2021-02-20 NOTE — Code Documentation (Signed)
HR 32 on monitor after Dr Quentin Cornwall provided compressions for 2 minutes. Pacing on zoll at this time.

## 2021-02-20 NOTE — ED Notes (Signed)
Epi drip decreased to 170mg/min per VO by Dr. BCheri Fowlerdue to BP 211/181.

## 2021-02-20 NOTE — Code Documentation (Signed)
Pulse check. Bedside US performed by Dr Cheri Fowler showing cardiac activity.

## 2021-02-20 NOTE — ED Notes (Addendum)
Epi continues to infuse at 158mg/min. Patient remains unresponsive, but has intermittent jerking like movements in upper and lower extremities. Awaiting family to arrive. Pharmacy notified for two more bags of epinephrine.

## 2021-02-20 NOTE — Consult Note (Signed)
ANTICOAGULATION CONSULT NOTE - Initial Consult  Pharmacy Consult for heparin infusion Indication: chest pain/ACS  No Known Allergies  Patient Measurements: Height: '5\' 4"'$  (162.6 cm) Weight: 66.7 kg (147 lb) IBW/kg (Calculated) : 59.2   Vital Signs: Temp: 97.7 F (36.5 C) (08/07 1517) Temp Source: Oral (08/07 1517) BP: 112/76 (08/07 1517) Pulse Rate: 139 (08/07 1517)  Labs: Recent Labs    03/04/2021 1539  HGB 15.3  HCT 44.1  PLT 229  CREATININE 0.99  TROPONINIHS 617*    Estimated Creatinine Clearance: 67.3 mL/min (by C-G formula based on SCr of 0.99 mg/dL).   Medical History: Past Medical History:  Diagnosis Date   Anxiety    Hyperlipidemia    Hypertension     Medications:  No prior anticoagulation noted   Assessment: 60 y.o. male presents for chest pain, shortness of breath, and palpitations. Referred to ER with abnormal EKG. Troponin 617. Pharmacy has been consulted for heparin infusion.  Goal of Therapy:  Heparin level 0.3-0.7 units/ml Monitor platelets by anticoagulation protocol: Yes   Plan:  Give 4000 units bolus x 1 Start heparin infusion at 800 units/hr Check anti-Xa level in 6 hours and daily while on heparin Continue to monitor H&H and platelets  Dorothe Pea, PharmD, BCPS Clinical Pharmacist   02/28/2021,5:08 PM

## 2021-02-20 NOTE — ED Notes (Addendum)
Family at the bedside with MD, charge RN, primary RN, and chaplain.

## 2021-02-20 NOTE — Code Documentation (Signed)
Pulse check, CPR resumed.

## 2021-02-20 NOTE — Code Documentation (Signed)
CPR resumed by [facility] device

## 2021-02-20 NOTE — Code Documentation (Signed)
CPR resumed 

## 2021-02-20 NOTE — ED Notes (Signed)
[facility] called, patient to be a possible skin/tissue donor. Referral [phone]. Eye care performed with saline, gauze, and paper tape.

## 2021-02-20 NOTE — ED Notes (Addendum)
MD and RT at the bedside.

## 2021-02-20 NOTE — ED Provider Notes (Signed)
[facility] Emergency Department Provider Note   ____________________________________________   Event Date/Time   First MD Initiated Contact with Patient 03/05/2021 1521     (approximate)  I have reviewed the triage vital signs and the nursing notes.   HISTORY  Chief Complaint Abnormal ECG    HPI Eddie Ward is a 60 y.o. male with below stated past medical history presents for chest pain, shortness of breath, and palpitations  LOCATION: Chest DURATION: 3 hours prior to arrival TIMING: Worsening since onset SEVERITY: Severe QUALITY: Shortness of breath CONTEXT: States has history of asthma and began feeling shortness of breath this morning that did not respond to his home inhalers and began feeling palpitations and chest pain before presenting to the EMS station where they found him to be tachycardic in the 130s and told him to present to the emergency department MODIFYING FACTORS: Exertion worsens the shortness of breath and rest partially relieves it ASSOCIATED SYMPTOMS: Chest pain, palpitations   Per medical record review, patient has history of asthma and tobacco abuse          Past Medical History:  Diagnosis Date   Anxiety    Hyperlipidemia    Hypertension     Patient Active Problem List   Diagnosis Date Noted   Asthma 01/25/2016   Allergic rhinitis 01/27/2015   Anxiety 01/27/2015   BP (high blood pressure) 01/27/2015   Hypertriglyceridemia 01/27/2015    Past Surgical History:  Procedure Laterality Date   TOTAL HIP ARTHROPLASTY Left    TOTAL HIP ARTHROPLASTY Right [id number]   [facility]   UMBILICAL HERNIA REPAIR  04/22/2007    Prior to Admission medications   Medication Sig Start Date End Date Taking? Authorizing Provider  albuterol (VENTOLIN HFA) 108 (90 Base) MCG/ACT inhaler TAKE 2 PUFFS BY MOUTH EVERY 6 HOURS AS NEEDED FOR WHEEZE OR SHORTNESS OF BREATH 01/05/21   Jerrol Banana., MD  amLODipine (NORVASC) 5 MG tablet TAKE  1 TABLET BY MOUTH  DAILY 10/20/20   Mar Daring, PA-C  amoxicillin-clavulanate (AUGMENTIN) 875-125 MG tablet Take 1 tablet by mouth 2 (two) times daily. 12/17/20   Chrismon, Vickki Muff, PA-C  clonazePAM (KLONOPIN) 0.5 MG tablet TAKE 1 TABLET BY MOUTH  TWICE DAILY AS NEEDED FOR  ANXIETY 09/07/20   Mar Daring, PA-C  doxepin (SINEQUAN) 10 MG capsule Take 1 capsule (10 mg total) by mouth at bedtime. 06/18/20   Mar Daring, PA-C  mometasone (ELOCON) 0.1 % cream Apply 1 application topically daily. 06/14/20   Mar Daring, PA-C  montelukast (SINGULAIR) 10 MG tablet TAKE 1 TABLET BY MOUTH AT  BEDTIME 02/14/21   Jerrol Banana., MD  predniSONE (DELTASONE) 10 MG tablet Take 6 tablets PO on day 1 and day 2, take 5 tablets PO on day 3 and day 4, take 4 tablets PO on day 5 and day 6, take 3 tablets PO on day 7 and day 8, take 2 tablets PO on day 9 and day 10, take one tablet PO on day 11 and day 12. 12/17/20   Chrismon, Vickki Muff, PA-C    Allergies Patient has no known allergies.  Family History  Problem Relation Age of Onset   Lymphoma Mother     Social History Social History   Tobacco Use   Smoking status: Every Day    Packs/day: 0.50    Types: Cigarettes   Smokeless tobacco: Never   Tobacco comments:    patient declined  counseling will taper off on his own  Substance Use Topics   Alcohol use: Yes    Alcohol/week: 1.0 - 2.0 standard drink    Types: 1 - 2 Standard drinks or equivalent per week   Drug use: No    Review of Systems Constitutional: No fever/chills Eyes: No visual changes. ENT: No sore throat. Cardiovascular: Endorses chest pain and palpitations Respiratory: Endorses shortness of breath. Gastrointestinal: No abdominal pain.  No nausea, no vomiting.  No diarrhea. Genitourinary: Negative for dysuria. Musculoskeletal: Negative for acute arthralgias Skin: Negative for rash. Neurological: Negative for headaches, weakness/numbness/paresthesias in  any extremity Psychiatric: Negative for suicidal ideation/homicidal ideation   ____________________________________________   PHYSICAL EXAM:  VITAL SIGNS: ED Triage Vitals  Enc Vitals Group     BP 03/10/2021 1517 112/76     Pulse Rate 03/09/2021 1517 (!) 139     Resp 03/09/2021 1517 20     Temp 03/07/2021 1517 97.7 F (36.5 C)     Temp Source 03/02/2021 1517 Oral     SpO2 03/10/2021 1517 91 %     Weight 02/24/2021 1518 147 lb (66.7 kg)     Height 02/19/2021 1518 '5\' 4"'$  (1.626 m)     Head Circumference --      Peak Flow --      Pain Score 02/19/2021 1518 4     Pain Loc --      Pain Edu? --      Excl. in [place]? --    Constitutional: Alert and oriented. Well appearing and in no acute distress. Eyes: Conjunctivae are normal. PERRL. Head: Atraumatic. Nose: No congestion/rhinnorhea. Mouth/Throat: Mucous membranes are moist. Neck: No stridor Cardiovascular: Grossly normal heart sounds.  Good peripheral circulation. Respiratory: 6 L by facemask.  Poor air movement over all lung fields.  Increased respiratory effort.  No retractions. Gastrointestinal: Soft and nontender. No distention. Musculoskeletal: No obvious deformities Neurologic:  Normal speech and language. No gross focal neurologic deficits are appreciated. Skin:  Skin is warm and dry. No rash noted. Psychiatric: Mood and affect are normal. Speech and behavior are normal.  ____________________________________________   LABS (all labs ordered are listed, but only abnormal results are displayed)  Labs Reviewed  COMPREHENSIVE METABOLIC PANEL - Abnormal; Notable for the following components:      Result Value   CO2 20 (*)    Glucose, Bld 158 (*)    Calcium 8.7 (*)    Total Bilirubin 1.3 (*)    All other components within normal limits  BRAIN NATRIURETIC PEPTIDE - Abnormal; Notable for the following components:   B Natriuretic Peptide 616.1 (*)    All other components within normal limits  CBC WITH DIFFERENTIAL/PLATELET - Abnormal;  Notable for the following components:   WBC 15.5 (*)    Neutro Abs 11.0 (*)    Monocytes Absolute 1.6 (*)    Abs Immature Granulocytes 0.09 (*)    All other components within normal limits  TROPONIN I (HIGH SENSITIVITY) - Abnormal; Notable for the following components:   Troponin I (High Sensitivity) 617 (*)    All other components within normal limits  RESP PANEL BY RT-PCR (FLU A&B, COVID) ARPGX2  URINALYSIS, COMPLETE (UACMP) WITH MICROSCOPIC  PROTIME-INR  APTT  CBC  HEPARIN LEVEL (UNFRACTIONATED)  TROPONIN I (HIGH SENSITIVITY)   ____________________________________________  EKG  ED ECG REPORT I, Naaman Plummer, the attending physician, personally viewed and interpreted this ECG.  Date: 03/13/2021 EKG Time: 1514 Rate: 139 Rhythm: Tachycardic sinus rhythm QRS Axis:  normal Intervals: normal ST/T Wave abnormalities: normal Narrative Interpretation: Sinus tachycardia.  No evidence of acute ischemia  ____________________________________________  RADIOLOGY  ED MD interpretation: Single view chest x-ray shows mild cardiomegaly with diffuse bilateral interstitial pulmonary opacities consistent with edema  CT angiography of the chest with pulmonary embolism protocol shows no evidence of pulmonary embolus but does show evidence of cardiomegaly with groundglass opacities in the mid and lower lung fields bilaterally with trace effusions likely reflecting edema  Official radiology report(s): DG Chest 1 View  Result Date: 02/26/2021 CLINICAL DATA:  Shortness of breath EXAM: CHEST  1 VIEW COMPARISON:  None. FINDINGS: Mild cardiomegaly. Diffuse bilateral interstitial pulmonary opacity. The visualized skeletal structures are unremarkable. IMPRESSION: Mild cardiomegaly with diffuse bilateral interstitial pulmonary opacity, likely edema. No focal airspace opacity. Electronically Signed   By: Eddie Candle M.D.   On: 02/17/2021 16:39   CT Angio Chest PE W/Cm &/Or Wo Cm  Result Date:  02/24/2021 CLINICAL DATA:  PE suspected, high prob.  Chest pain, weakness EXAM: CT ANGIOGRAPHY CHEST WITH CONTRAST TECHNIQUE: Multidetector CT imaging of the chest was performed using the standard protocol during bolus administration of intravenous contrast. Multiplanar CT image reconstructions and MIPs were obtained to evaluate the vascular anatomy. CONTRAST:  52m OMNIPAQUE IOHEXOL 350 MG/ML SOLN COMPARISON:  None. FINDINGS: Cardiovascular: No filling defects in the pulmonary arteries to suggest pulmonary emboli. Heart is mildly enlarged. Aorta normal caliber. Coronary artery and aortic calcifications. Mediastinum/Nodes: No mediastinal, hilar, or axillary adenopathy. Trachea and esophagus are unremarkable. Thyroid unremarkable. Lungs/Pleura: Ground-glass opacities noted in the mid and lower lungs bilaterally, left slightly greater than right. Interstitial thickening. Trace bilateral effusions. Upper Abdomen: Reflux of contrast into the IVC and hepatic veins suggests right heart dysfunction. Musculoskeletal: Chest wall soft tissues are unremarkable. No acute bony abnormality. Review of the MIP images confirms the above findings. IMPRESSION: No evidence of pulmonary embolus. Cardiomegaly. Reflux of contrast into the IVC and hepatic veins suggests right heart dysfunction. Ground-glass opacities in the mid and lower lung zones bilaterally with trace effusions. Findings could reflect edema or infection. Favor atypical infection. Aortic Atherosclerosis (ICD10-I70.0). Electronically Signed   By: KRolm BaptiseM.D.   On: 03/08/2021 17:36    ____________________________________________   PROCEDURES  Procedure(s) performed (including Critical Care):  .1-3 Lead EKG Interpretation  Date/Time: 03/15/2021 11:25 PM Performed by: BNaaman Plummer MD Authorized by: BNaaman Plummer MD     Interpretation: abnormal     ECG rate:  132   ECG rate assessment: tachycardic     Rhythm: sinus tachycardia     Ectopy: PAC and  PVCs     Conduction: normal   .Critical Care  Date/Time: 03/08/2021 11:25 PM Performed by: BNaaman Plummer MD Authorized by: BNaaman Plummer MD   Critical care provider statement:    Critical care time (minutes):  867  Critical care time was exclusive of:  Separately billable procedures and treating other patients   Critical care was necessary to treat or prevent imminent or life-threatening deterioration of the following conditions:  Cardiac failure, respiratory failure and shock   Critical care was time spent personally by me on the following activities:  Discussions with consultants, evaluation of patient's response to treatment, examination of patient, ordering and performing treatments and interventions, ordering and review of laboratory studies, ordering and review of radiographic studies, pulse oximetry, re-evaluation of patient's condition, obtaining history from patient or surrogate and review of old charts   I assumed direction of  critical care for this patient from another provider in my specialty: no     Care discussed with: admitting provider   Procedure Name: Intubation Date/Time: 02/14/2021 11:26 PM Performed by: Naaman Plummer, MD Pre-anesthesia Checklist: Patient identified, Patient being monitored, Emergency Drugs available, Timeout performed and Suction available Oxygen Delivery Method: Non-rebreather mask Preoxygenation: Pre-oxygenation with 100% oxygen Induction Type: Rapid sequence Ventilation: Mask ventilation without difficulty Laryngoscope Size: Glidescope Grade View: Grade I Tube size: 8.0 mm Number of attempts: 1 Airway Equipment and Method: Video-laryngoscopy Placement Confirmation: ETT inserted through vocal cords under direct vision, CO2 detector and Breath sounds checked- equal and bilateral Secured at: 25 cm Tube secured with: ETT holder Dental Injury: Teeth and Oropharynx as per pre-operative assessment        ____________________________________________   INITIAL IMPRESSION / ASSESSMENT AND PLAN / ED COURSE  As part of my medical decision making, I reviewed the following data within the electronic medical record, if available:  Nursing notes reviewed and incorporated, Labs reviewed, EKG interpreted, Old chart reviewed, Radiograph reviewed and Notes from prior ED visits reviewed and incorporated        Patient is a 60 year old male who presents for chest pain and shortness of breath over the last 3 days.  Differential diagnosis for this patient includes but is not limited to: STEMI, ACS, PE, aortic dissection, asthma exacerbation  Work-up significant for: Troponin 617 BNP 616 WBC 15.5 Pulmonary edema and cardiomegaly on x-ray and CT  -After patient returned from CT, his respiratory status worsened and he became unresponsive as well as pulseless, CPR was started and multiple rounds of epinephrine, 1 mg atropine, 300 mg amiodarone were given prior to ROSC.  Patient had a palpable pulse and readable blood pressure prior to losing pulses once again approximately 2 minutes later.  Patient placed on an epinephrine drip before we obtained ROSC once more.  Dr. Fletcher Anon in cardiology was present for a portion of this code as well as during a bedside ultrasound showing minimal cardiac wall motion with estimated ejection fraction of approximately 5%.  The decision was made at this time to forego catheterization given pain over the last 3 days and likely irreversible damage to the heart muscle.  I spoke to patient's mother over the phone who asked for [place] to continue resuscitative efforts until they can get here and see him. Upon arrival family was taken back to see patient and the decision was made to withdraw care soon after their arrival.  All vasoactive medications were stopped as well as mechanical ventilation and patient passed peacefully with time of death [id number]  Dispo: Expired       ____________________________________________   FINAL CLINICAL IMPRESSION(S) / ED DIAGNOSES  Final diagnoses:  None     ED Discharge Orders     None        Note:  This document was prepared using Dragon voice recognition software and may include unintentional dictation errors.    Naaman Plummer, MD 03/02/2021 [phone]

## 2021-02-20 NOTE — ED Notes (Addendum)
Have been attempting to obtain a BP for the last 10 minutes. Pulse check again. No pulses felt. Continuing care.

## 2021-02-20 NOTE — Code Documentation (Signed)
Levo started at '50mg'$ /kg/min per Dr Cheri Fowler VO

## 2021-02-20 NOTE — ED Notes (Signed)
Georgina Peer RN put epi drip on IV pump at 183mg/min

## 2021-02-20 NOTE — ED Notes (Signed)
Sister in family room. Mother on the way. Family states to continue care until more family arrives.

## 2021-02-20 NOTE — ED Notes (Signed)
No pulses present. CPR resumed.

## 2021-02-20 NOTE — ED Notes (Signed)
Dr. Cheri Fowler at the bedside, TOD called--[phone]

## 2021-02-20 NOTE — ED Notes (Addendum)
Care continued per family request

## 2021-02-20 NOTE — Consult Note (Signed)
Cardiology Consultation:   Ward ID: RAYDYN HEWEY MRN: [id number]; DOB: [birth year]  Admit date: 02/15/2021 Date of Consult: 02/15/2021  PCP:  Jerrol Banana., MD   [facility] Providers Cardiologist:  None        Ward Profile:   Eddie Ward is a 60 y.o. male with a hx of asthma and hypertension who is being seen 02/25/2021 for Eddie evaluation of cardiac arrest at Eddie request of Dr. Cheri Fowler.  History of Present Illness:   Eddie Ward is a 60 year old male with no prior cardiac history.  He has known history of asthma and hypertension.  He had COVID-19 infection in early June but did not require hospitalization.  I could not obtain history from Eddie Ward as CPR was in progress.  According to his friend, he did complain of chest pain on Friday thought to be due to GERD and has not been feeling well since then with decreased appetite and fatigue.  However, his chest pain worsened today with associated shortness of breath and he decided to go to Eddie EMT station where he was noted to be tachycardic and in mild respiratory distress.  EKG showed minor anterolateral ST depression with no ST elevation.  Eddie Ward was sent to Eddie ED for evaluation.  His EKG did not meet any STEMI criteria.  Initially I felt that his tachycardia might be atypical atrial flutter.  Given his shortness of breath and tachycardia, Eddie Ward was sent for a CTA of Eddie chest to evaluate for possible pulmonary embolism.  As soon as he returned, he went into cardiopulmonary arrest and was intubated and CPR was initiated.  Eddie Ward continues to be mostly in PEA with CPR for longer than 1 hour. CTA of Eddie chest showed interstitial pulmonary edema with no evidence of pulmonary embolism.   Past Medical History:  Diagnosis Date   Anxiety    Hyperlipidemia    Hypertension     Past Surgical History:  Procedure Laterality Date   TOTAL HIP ARTHROPLASTY Left    TOTAL HIP ARTHROPLASTY Right [id number]   [facility]    UMBILICAL HERNIA REPAIR  04/22/2007     Home Medications:  Prior to Admission medications   Medication Sig Start Date End Date Taking? Authorizing Provider  albuterol (VENTOLIN HFA) 108 (90 Base) MCG/ACT inhaler TAKE 2 PUFFS BY MOUTH EVERY 6 HOURS AS NEEDED FOR WHEEZE OR SHORTNESS OF BREATH 01/05/21   Jerrol Banana., MD  amLODipine (NORVASC) 5 MG tablet TAKE 1 TABLET BY MOUTH  DAILY 10/20/20   Mar Daring, PA-C  amoxicillin-clavulanate (AUGMENTIN) 875-125 MG tablet Take 1 tablet by mouth 2 (two) times daily. 12/17/20   Chrismon, Vickki Muff, PA-C  clonazePAM (KLONOPIN) 0.5 MG tablet TAKE 1 TABLET BY MOUTH  TWICE DAILY AS NEEDED FOR  ANXIETY 09/07/20   Mar Daring, PA-C  doxepin (SINEQUAN) 10 MG capsule Take 1 capsule (10 mg total) by mouth at bedtime. 06/18/20   Mar Daring, PA-C  mometasone (ELOCON) 0.1 % cream Apply 1 application topically daily. 06/14/20   Mar Daring, PA-C  montelukast (SINGULAIR) 10 MG tablet TAKE 1 TABLET BY MOUTH AT  BEDTIME 02/14/21   Jerrol Banana., MD  predniSONE (DELTASONE) 10 MG tablet Take 6 tablets PO on day 1 and day 2, take 5 tablets PO on day 3 and day 4, take 4 tablets PO on day 5 and day 6, take 3 tablets PO on day 7 and day 8,  take 2 tablets PO on day 9 and day 10, take one tablet PO on day 11 and day 12. 12/17/20   Chrismon, Vickki Muff, PA-C    Inpatient Medications: Scheduled Meds:  aspirin  324 mg Oral Once   diltiazem  10 mg Intravenous Once   EPINEPHrine       heparin  4,000 Units Intravenous Once   sodium chloride flush  3 mL Intravenous Q12H   tenecteplase       tenecteplase  35 mg Intravenous Once   Continuous Infusions:  sodium chloride     sodium chloride     epinephrine 20 mcg/min (03/02/2021 1809)   heparin     norepinephrine (LEVOPHED) Adult infusion 100 mcg/min (02/27/2021 1748)   PRN Meds: sodium chloride, sodium chloride flush  Allergies:   No Known Allergies  Social History:   Social History    Socioeconomic History   Marital status: Single    Spouse name: Not on file   Number of children: Not on file   Years of education: Not on file   Highest education level: Not on file  Occupational History   Not on file  Tobacco Use   Smoking status: Every Day    Packs/day: 0.50    Types: Cigarettes   Smokeless tobacco: Never   Tobacco comments:    Ward declined counseling will taper off on his own  Substance and Sexual Activity   Alcohol use: Yes    Alcohol/week: 1.0 - 2.0 standard drink    Types: 1 - 2 Standard drinks or equivalent per week   Drug use: No   Sexual activity: Not Currently  Other Topics Concern   Not on file  Social History Narrative   Not on file   Social Determinants of Health   Financial Resource Strain: Not on file  Food Insecurity: Not on file  Transportation Needs: Not on file  Physical Activity: Not on file  Stress: Not on file  Social Connections: Not on file  Intimate Partner Violence: Not on file    Family History:    Family History  Problem Relation Age of Onset   Lymphoma Mother      ROS:  Please see Eddie history of present illness.   All other ROS reviewed and negative.     Physical Exam/Data:   Vitals:   03/14/2021 1754 03/10/2021 1759 03/06/2021 1815 03/10/2021 1839  BP: (!) 49/24  (!) 163/39 (!) 73/41  Pulse:  (!) 103 (!) 102   Resp:      Temp:      TempSrc:      SpO2:  100% 100%   Weight:      Height:        Intake/Output Summary (Last 24 hours) at 03/07/2021 1841 Last data filed at 03/10/2021 1723 Gross per 24 hour  Intake 250 ml  Output --  Net 250 ml   Last 3 Weights 03/08/2021 12/17/2020 06/18/2020  Weight (lbs) 147 lb 148 lb 153 lb 1.6 oz  Weight (kg) 66.679 kg 67.132 kg 69.446 kg     Body mass index is 25.23 kg/m.  General: Intubated with CPR in progress HEENT: normal Lymph: no adenopathy Neck: no JVD Endocrine:  No thryomegaly Vascular: No carotid bruits; FA pulses 2+ bilaterally without bruits  Cardiac:   normal S1, S2; RRR; no murmur  Lungs: Intubated Abd: soft, nontender, no hepatomegaly  Ext: no edema Musculoskeletal:  No deformities, BUE and BLE strength normal and equal Skin: warm and dry  Neuro: Not able to evaluate Psych: Not able to evaluate  EKG:  Eddie EKG was personally reviewed and demonstrates: Sinus tachycardia with PVCs, minor anterolateral ST depression.  No ST elevation.   Relevant CV Studies:   Laboratory Data:  High Sensitivity Troponin:   Recent Labs  Lab 03/07/2021 1539  TROPONINIHS 617*     Chemistry Recent Labs  Lab 03/16/2021 1539  NA 137  K 3.8  CL 106  CO2 20*  GLUCOSE 158*  BUN 6  CREATININE 0.99  CALCIUM 8.7*  GFRNONAA >60  ANIONGAP 11    Recent Labs  Lab 03/01/2021 1539  PROT 6.8  ALBUMIN 4.2  AST 24  ALT 23  ALKPHOS 78  BILITOT 1.3*   Hematology Recent Labs  Lab 03/05/2021 1539  WBC 15.5*  RBC 5.11  HGB 15.3  HCT 44.1  MCV 86.3  MCH 29.9  MCHC 34.7  RDW 15.2  PLT 229   BNP Recent Labs  Lab 03/11/2021 1539  BNP 616.1*    DDimer No results for input(s): DDIMER in Eddie last 168 hours.   Radiology/Studies:  DG Chest 1 View  Result Date: 03/06/2021 CLINICAL DATA:  Shortness of breath EXAM: CHEST  1 VIEW COMPARISON:  None. FINDINGS: Mild cardiomegaly. Diffuse bilateral interstitial pulmonary opacity. Eddie visualized skeletal structures are unremarkable. IMPRESSION: Mild cardiomegaly with diffuse bilateral interstitial pulmonary opacity, likely edema. No focal airspace opacity. Electronically Signed   By: Eddie Candle M.D.   On: 02/14/2021 16:39   CT Angio Chest PE W/Cm &/Or Wo Cm  Result Date: 03/09/2021 CLINICAL DATA:  PE suspected, high prob.  Chest pain, weakness EXAM: CT ANGIOGRAPHY CHEST WITH CONTRAST TECHNIQUE: Multidetector CT imaging of Eddie chest was performed using Eddie standard protocol during bolus administration of intravenous contrast. Multiplanar CT image reconstructions and MIPs were obtained to evaluate Eddie vascular  anatomy. CONTRAST:  35m OMNIPAQUE IOHEXOL 350 MG/ML SOLN COMPARISON:  None. FINDINGS: Cardiovascular: No filling defects in Eddie pulmonary arteries to suggest pulmonary emboli. Heart is mildly enlarged. Aorta normal caliber. Coronary artery and aortic calcifications. Mediastinum/Nodes: No mediastinal, hilar, or axillary adenopathy. Trachea and esophagus are unremarkable. Thyroid unremarkable. Lungs/Pleura: Ground-glass opacities noted in Eddie mid and lower lungs bilaterally, left slightly greater than right. Interstitial thickening. Trace bilateral effusions. Upper Abdomen: Reflux of contrast into Eddie IVC and hepatic veins suggests right heart dysfunction. Musculoskeletal: Chest wall soft tissues are unremarkable. No acute bony abnormality. Review of Eddie MIP images confirms Eddie above findings. IMPRESSION: No evidence of pulmonary embolus. Cardiomegaly. Reflux of contrast into Eddie IVC and hepatic veins suggests right heart dysfunction. Ground-glass opacities in Eddie mid and lower lung zones bilaterally with trace effusions. Findings could reflect edema or infection. Favor atypical infection. Aortic Atherosclerosis (ICD10-I70.0). Electronically Signed   By: KRolm BaptiseM.D.   On: 02/19/2021 17:36     Assessment and Plan:   Cardiopulmonary arrest: EKG is not consistent with a STEMI.  However, Eddie Ward's presentation and subsequent development of cardiac arrest is highly suggestive of a cardiac event.  It seems that Eddie Ward was having chest pain since at least Friday.  Unfortunately, CPR has been in progress for more than 60 minutes and at this point, I doubt any meaningful neurologic recovery.  I do not think taking him to Eddie Cath Lab would make a difference at this time.    For questions or updates, please contact CAdvancePlease consult [email or web address] for contact info under    Signed, MKathlyn Sacramento MD  02/25/2021 6:41 PM

## 2021-02-20 NOTE — Code Documentation (Signed)
Few second run of vtach noted on zoll

## 2021-02-20 NOTE — ED Notes (Signed)
TNK given IVP 13m By [facility]ks Ranger

## 2021-02-20 NOTE — ED Notes (Signed)
Care transferred, report received from [place], [place]

## 2021-02-20 NOTE — Code Documentation (Signed)
Dr Cheri Fowler intubing at this time with RT

## 2021-02-20 NOTE — ED Notes (Signed)
Patient's wallet, cell phone, and car keys given to his friend, Zenia Resides, per his mother and father's request.

## 2021-02-20 NOTE — Code Documentation (Signed)
Pt returned from CT not breathing and unresponsive. Claiborne Billings RN and Anda Kraft RN to bedside. Called Dr Cheri Fowler to bedside. Dr Quentin Cornwall also arrived at bedside. Emergency equipment grabbed and placed at beside. RT called.

## 2021-02-20 NOTE — Code Documentation (Signed)
CPR on lucas resumed.

## 2021-02-20 NOTE — Code Documentation (Signed)
Cardiologist at bedside.  

## 2021-02-20 NOTE — ED Notes (Signed)
Pulse check. Switching batteries on the lucas device. Pulses felt by Dr bradler

## 2021-02-20 NOTE — ED Notes (Signed)
Pulse check, bedside US being performed again.

## 2021-02-20 NOTE — ED Notes (Signed)
MD at the bedside  

## 2021-02-20 NOTE — ED Notes (Addendum)
RT remains at the bedside. Epi infusing, care continued.

## 2021-02-20 NOTE — ED Notes (Signed)
Patient tagged and bagged. ET tube and all IV's left in patient. Transport called to take patient to the morgue.

## 2021-02-20 NOTE — ED Notes (Addendum)
Care continued, family remains at the bedside.

## 2021-02-20 NOTE — Code Documentation (Signed)
Bedside US performed again. Faint pulses present.

## 2021-02-20 NOTE — Progress Notes (Signed)
Initially called when patient first coded. Met business partner who contacted family. Called again to provide support to family in room and in family consult room. Please call chaplain if additional support is needed.

## 2021-02-20 NOTE — ED Notes (Signed)
Pulse check, CPR resumed.

## 2021-02-20 NOTE — Code Documentation (Signed)
Levo rate changed to '150mg'$ /min VO from Dr Cheri Fowler

## 2021-02-20 NOTE — Code Documentation (Signed)
Faint pulses present

## 2021-02-20 NOTE — Code Documentation (Signed)
Les ED medic at bedside

## 2021-02-20 NOTE — ED Notes (Signed)
The rest of the family has arrived and placed in the family room. Dr. Cheri Fowler with family.

## 2021-02-20 NOTE — ED Notes (Signed)
PEA on the monitor. Unable to obtain O2 sats. No resp observed.

## 2021-02-20 NOTE — ED Notes (Addendum)
Awaiting arrival of more family members. Involuntary jerking movements continue in bilateral upper and lower extremities. Dr. Cheri Fowler notified and states that this is not new, that the patient has been doing this. Epi drip continues to infuse at 175mg/min. Care continued

## 2021-02-20 NOTE — ED Notes (Signed)
Dr. Cheri Fowler at the bedside. Epi stopped per VO. Instructed to call RT to have pt extubated.

## 2021-02-20 NOTE — ED Notes (Addendum)
Family remains at the bedside. Epi continues. Care continued

## 2021-02-20 NOTE — Code Documentation (Addendum)
Pt shocked at 120j by Dr Quentin Cornwall for vfib. CPR resumed

## 2021-02-20 NOTE — Code Documentation (Signed)
No pulses felt, CPR resumed.

## 2021-02-20 NOTE — ED Notes (Signed)
Pulse check. No pulses present. CPR resumed.

## 2021-02-20 NOTE — Code Documentation (Signed)
Pulse check. Carotid pusles felt by Dr Cheri Fowler

## 2021-02-20 NOTE — ED Notes (Signed)
Linton Rump providing CPR to circulate epi drip going.

## 2021-02-20 NOTE — ED Triage Notes (Signed)
Pt arrives to ER for abnormal EKG. Went to [facility] and had paramedic check him out. Was told to come to ER. Pt states he has CP, weakness. States hasn't eaten for a few days. States hx of reflux. Placed on 2L in triage for comfort. HR elevated in triage.

## 2021-02-20 NOTE — ED Notes (Signed)
Pt going in and out of vtach and vfib on own. zoll is charged and shock given 200j.

## 2021-02-20 NOTE — Code Documentation (Signed)
Called pharmacy for epi drip

## 2021-02-20 NOTE — ED Notes (Addendum)
No pulses present. BP shown on monitor 104/56. Rechecked BP and was 108/83. Perfusing rhythm on pt. Second epi drip hung wide open at this time.

## 2021-02-20 NOTE — Code Documentation (Signed)
Unable to obtain BP, faint pulses, CPR resumed.

## 2021-02-20 NOTE — ED Notes (Signed)
Pulse check, faint pulses present.

## 2021-02-20 NOTE — Code Documentation (Signed)
Epi drip hung at this time, VO to hand wide open.

## 2021-02-20 NOTE — ED Notes (Signed)
Family states that they are going back to the family room. Patient's mother states that the epinephrine can be turned off now. Dr. Cheri Fowler notified and aware.

## 2021-02-20 NOTE — Code Documentation (Signed)
No pulses present. CPR resumed. Linton Rump initiated.

## 2021-02-20 NOTE — ED Notes (Signed)
Family notified of patient passing and time of death.

## 2021-02-21 LAB — PROTIME-INR
INR: 1.2 (ref 0.8–1.2)
Prothrombin Time: 14.9 seconds (ref 11.4–15.2)

## 2021-02-21 MED FILL — Medication: Qty: 1 | Status: AC

## 2021-02-23 ENCOUNTER — Telehealth: Payer: Self-pay

## 2021-02-23 NOTE — Telephone Encounter (Signed)
Copied from [place] [id number]. Topic: General - Deceased Patient >> 03/09/21 11:31 AM Pawlus, Brayton Layman A wrote: Reason for CRM: [place] called in to ensure Dr Rosanna Randy would sign the death certificate, please advise if it has been received using the DAVE system or if another method must be used.

## 2021-02-23 NOTE — Telephone Encounter (Signed)
Reason for CRM: [place] called in to ensure Dr Rosanna Randy would sign the death certificate, please advise if it has been received using the DAVE system or if another method must be used.

## 2021-03-17 NOTE — ED Notes (Signed)
Patient transported to the morgue, all paperwork transported with patient.

## 2021-03-17 NOTE — ED Notes (Signed)
Still awaiting transport to take pt to the morgue. [facility]eral home paperwork and transport slip at the bedside

## 2021-03-17 DEATH — deceased

## 2022-06-30 IMAGING — DX DG CHEST 1V
1 series · 1 of 1 positions shown · non-contrast
Comparison: None.

CLINICAL DATA: Shortness of breath

EXAM:
CHEST  1 VIEW

[chest pa]
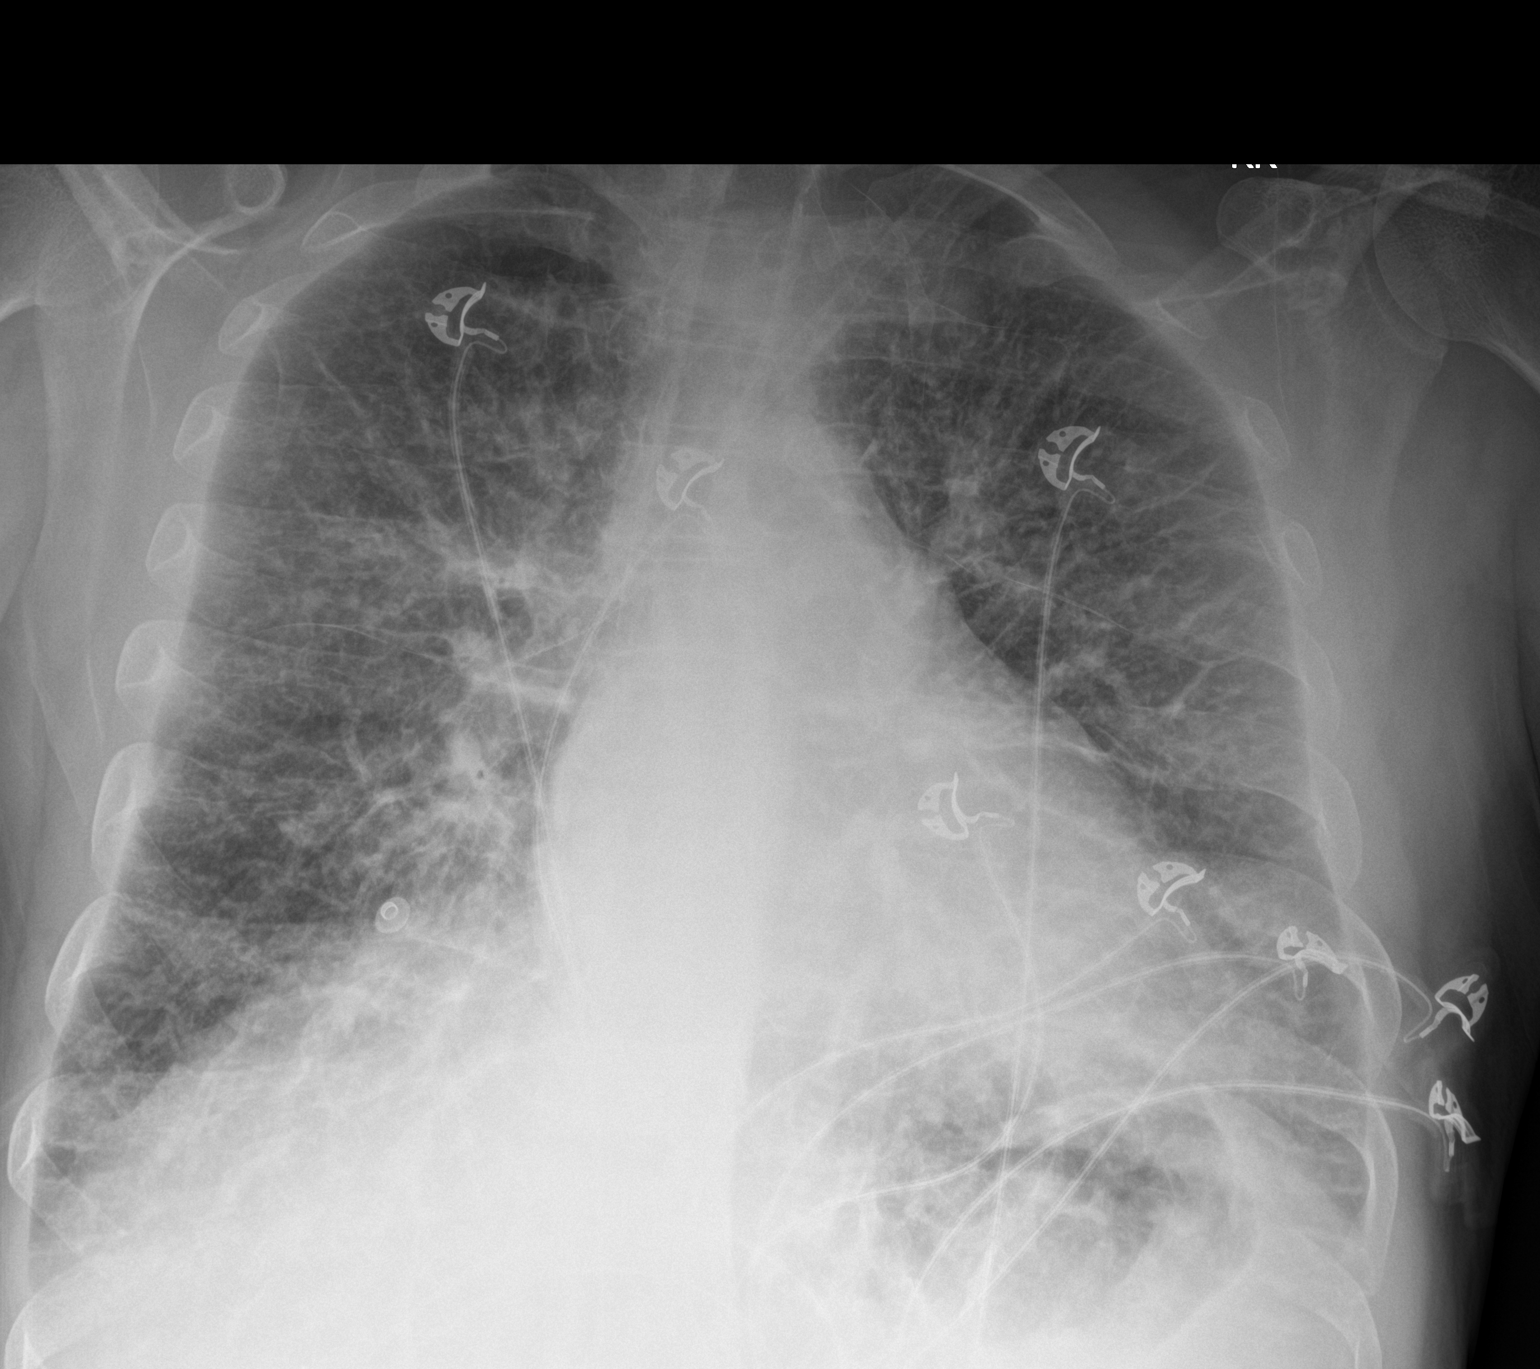

[1 of 1 positions shown; findings below may reference images not displayed]

FINDINGS: Mild cardiomegaly. Diffuse bilateral interstitial pulmonary opacity.
The visualized skeletal structures are unremarkable.
IMPRESSION: Mild cardiomegaly with diffuse bilateral interstitial pulmonary
opacity, likely edema. No focal airspace opacity.
